# Patient Record
Sex: Female | Born: 1980 | Race: White | Hispanic: No | Marital: Married | Smoking: Current every day smoker
Health system: Southern US, Community
[De-identification: ages and names within clinical notes are randomized; demographics above are authoritative.]

## PROBLEM LIST (undated history)

## (undated) DIAGNOSIS — Z8489 Family history of other specified conditions: Secondary | ICD-10-CM

## (undated) DIAGNOSIS — K219 Gastro-esophageal reflux disease without esophagitis: Secondary | ICD-10-CM

## (undated) DIAGNOSIS — R519 Headache, unspecified: Secondary | ICD-10-CM

## (undated) DIAGNOSIS — F419 Anxiety disorder, unspecified: Secondary | ICD-10-CM

## (undated) HISTORY — PX: WISDOM TOOTH EXTRACTION: SHX21

## (undated) HISTORY — PX: TUBAL LIGATION: SHX77

---

## 2001-03-30 ENCOUNTER — Other Ambulatory Visit: Admission: RE | Admit: 2001-03-30 | Discharge: 2001-03-30 | Payer: Self-pay | Admitting: Obstetrics and Gynecology

## 2003-11-19 ENCOUNTER — Inpatient Hospital Stay (HOSPITAL_COMMUNITY): Admission: AD | Admit: 2003-11-19 | Discharge: 2003-11-19 | Payer: Self-pay | Admitting: Gynecology

## 2003-12-19 ENCOUNTER — Inpatient Hospital Stay (HOSPITAL_COMMUNITY): Admission: AD | Admit: 2003-12-19 | Discharge: 2003-12-22 | Payer: Self-pay | Admitting: Gynecology

## 2003-12-21 ENCOUNTER — Encounter (INDEPENDENT_AMBULATORY_CARE_PROVIDER_SITE_OTHER): Payer: Self-pay | Admitting: Specialist

## 2005-07-11 ENCOUNTER — Ambulatory Visit: Payer: Self-pay | Admitting: Obstetrics & Gynecology

## 2005-07-16 ENCOUNTER — Ambulatory Visit (HOSPITAL_COMMUNITY): Admission: RE | Admit: 2005-07-16 | Discharge: 2005-07-16 | Payer: Self-pay | Admitting: *Deleted

## 2006-05-16 ENCOUNTER — Inpatient Hospital Stay (HOSPITAL_COMMUNITY): Admission: AD | Admit: 2006-05-16 | Discharge: 2006-05-16 | Payer: Self-pay | Admitting: Family Medicine

## 2009-12-21 ENCOUNTER — Emergency Department: Payer: Self-pay | Admitting: Emergency Medicine

## 2010-03-21 ENCOUNTER — Emergency Department: Payer: Self-pay | Admitting: Emergency Medicine

## 2010-09-14 NOTE — Op Note (Signed)
NAMEFREDRICA, CAPANO                          ACCOUNT NO.:  [id number]   MEDICAL RECORD NO.:  [id number]                    PATIENT TYPE:   LOCATION:                                       FACILITY:   PHYSICIAN:  Ginger Carne, MD               DATE OF BIRTH:   DATE OF PROCEDURE:  12/21/2003  DATE OF DISCHARGE:                                 OPERATIVE REPORT   PREOPERATIVE DIAGNOSES:  Request for permanent sterilization.   POSTOPERATIVE DIAGNOSES:  Request for permanent sterilization.   PROCEDURE:  [place] postpartum bilateral tubal ligation.   SURGEON:  Ginger Carne, MD   ASSISTANT:  None.   COMPLICATIONS:  None immediate.   ESTIMATED BLOOD LOSS:  Negligible.   ANESTHESIA:  Epidural top off.   SPECIMENS:  Right and left partial tubes.   FINDINGS:  Both tubes were identified separate and apart from their  respective round ligaments. These were identified from their isthmus to  their fimbriated ends. Both ovaries were normal.   DESCRIPTION OF PROCEDURE:  The patient was prepped and draped in the usual  fashion and placed in the supine position. Betadine solution used for  antiseptic and the patient was catheterized prior to the procedure.  After  adequate epidural top off, a vertical infraumbilical incision was made and  the abdomen opened.  Once again both tubes were identified separate and  apart from their respective round ligaments.  Two centimeters of tube were  incorporated between #0 chromic ties twice. The tubes were severed above the  ties and the tips cauterized. Specimens bilaterally sent to pathology. No  active bleeding noted. Closure of the fascia with #0 Vicryl running suture  and 4-0 Vicryl for subcuticular closure. Instrument and sponge count were  correct. The patient tolerated the procedure well and went to post  anesthesia recovery in excellent condition.                                               Ginger Carne, MD    SHB/MEDQ  D:   12/21/2003  T:  12/21/2003  Job:  [id number]

## 2010-09-14 NOTE — Group Therapy Note (Signed)
NAMESUTTYN, CRYDER NO.:  [id number]   MEDICAL RECORD NO.:  [id number]           PATIENT TYPE:   LOCATION:  [facility]                     FACILITY:   PHYSICIAN:  Elsie Lincoln, MD           DATE OF BIRTH:   DATE OF SERVICE:  07/11/2005                                    CLINIC NOTE   [facility]   This is a 30 year old G2 female, who has had abnormal uterine bleeding and  pelvic pain for a year.  Before the birth of her last child, she had  completely normal menses.  She has had a Provera withdrawal in the past, and  had a normal TSH and a normal FSH back in July of 2006.  She is not on any  birth control right now.  However, she has had a tubal ligation after the  birth of her last baby.  She also has severe dysmenorrhea and is worried.  She thinks she may have endometriosis.  Both her sister and her mom have  endometriosis, and her mother with underwent a hysterectomy at 59 for  endometriosis.  She has no urinary complaints, but; however, her bowel  movements are disruptive to her life.  She has 3 non-formed stools a day.  She also has urgency with bowel movements.  When she has the urge to go, she  feels she has to go to the bathroom immediately or she will not make it.  She never has had a diagnosis of irritable bowel syndrome.  She does not  think she is lactose intolerant; however, ice cream does make it worse.  She  does remember going several days without dairy products in her diet and she  still has to 3 unformed stools.  However, we will recommend a dairy-free  diet.   PAST MEDICAL HISTORY:  No change.   PAST SURGICAL HISTORY:  BTL.   PAST GYNECOLOGICAL HISTORY:  NSVD x2, history of bacterial vaginosis and  yeast.   SOCIAL HISTORY:  Smokes occasionally.  Encouraged to quit.   REVIEW OF SYMPTOMS:  As above.   PHYSICAL EXAMINATION:  VITAL SIGNS:  Blood pressure 116/79, pulse 85, height  5 feet 4 inches and weight  258.  GENERAL:  Obese, well-nourished and well-developed in no apparent distress.  ABDOMEN:  Soft, nontender and nondistended, no organomegaly, no rebound, no  guarding and no hernias.  GENITALIA:  Tanner V.  Some evidence on her mons pubis and legs of old areas  of folliculitis that are now well healed.  The vagina pink and normal rugae.  Urethral meatus, no prolapse.  Vulva no lesion.  Cervix closed and  nontender.  Uterus difficult to palpate; however, nontender.  Bilateral  adnexa are both tender, but no masses felt, the right side was worse than  the left.  RECTAL EXAM:  Deferred.   ASSESSMENT AND PLAN:  A 30 year old female with irregular menses and pelvic  pain.  1.  Urine pregnancy test today.  2.  Gonorrhea and Chlamydia today.  3.  Transvaginal ultrasound to evaluate for cysts, fibroids  and adenomyosis.  4.  Endometrial biopsy in 2 weeks with Motrin 600 mg pre-procedure.  5.  Dairy-free diet.  6.  If the above are all normal, we will refer her to primary care physician      for IBS workup.           ______________________________  Elsie Lincoln, MD     KL/MEDQ  D:  07/11/2005  T:  07/11/2005  Job:  [id number]

## 2010-10-07 ENCOUNTER — Emergency Department: Payer: Self-pay | Admitting: Internal Medicine

## 2010-11-27 ENCOUNTER — Ambulatory Visit: Payer: Self-pay | Admitting: Family Medicine

## 2013-07-08 ENCOUNTER — Ambulatory Visit: Payer: Self-pay | Admitting: Nurse Practitioner

## 2015-01-27 ENCOUNTER — Ambulatory Visit
Admission: RE | Admit: 2015-01-27 | Payer: BLUE CROSS/BLUE SHIELD | Source: Ambulatory Visit | Admitting: Unknown Physician Specialty

## 2015-01-27 ENCOUNTER — Encounter: Admission: RE | Payer: Self-pay | Source: Ambulatory Visit

## 2015-01-27 ENCOUNTER — Ambulatory Visit: Admit: 2015-01-27 | Payer: Self-pay | Admitting: Unknown Physician Specialty

## 2015-01-27 SURGERY — ESOPHAGOGASTRODUODENOSCOPY (EGD) WITH PROPOFOL
Anesthesia: General

## 2016-11-21 ENCOUNTER — Emergency Department
Admission: EM | Admit: 2016-11-21 | Discharge: 2016-11-21 | Disposition: A | Discharge status: Home / Self Care | Payer: 59 | Attending: Emergency Medicine | Admitting: Emergency Medicine

## 2016-11-21 ENCOUNTER — Emergency Department: Payer: 59

## 2016-11-21 ENCOUNTER — Encounter: Payer: Self-pay | Admitting: Emergency Medicine

## 2016-11-21 DIAGNOSIS — Z5321 Procedure and treatment not carried out due to patient leaving prior to being seen by health care provider: Secondary | ICD-10-CM | POA: Insufficient documentation

## 2016-11-21 DIAGNOSIS — R42 Dizziness and giddiness: Secondary | ICD-10-CM | POA: Diagnosis present

## 2016-11-21 LAB — DIFFERENTIAL
BASOS PCT: 1 %
Basophils Absolute: 0.1 10*3/uL (ref 0–0.1)
EOS ABS: 0.1 10*3/uL (ref 0–0.7)
EOS PCT: 1 %
Lymphocytes Relative: 16 %
Lymphs Abs: 1.8 10*3/uL (ref 1.0–3.6)
MONO ABS: 0.7 10*3/uL (ref 0.2–0.9)
Monocytes Relative: 6 %
NEUTROS ABS: 8.5 10*3/uL — AB (ref 1.4–6.5)
Neutrophils Relative %: 76 %

## 2016-11-21 LAB — COMPREHENSIVE METABOLIC PANEL
ALT: 28 U/L (ref 14–54)
ANION GAP: 8 (ref 5–15)
AST: 28 U/L (ref 15–41)
Albumin: 3.8 g/dL (ref 3.5–5.0)
Alkaline Phosphatase: 75 U/L (ref 38–126)
BUN: 8 mg/dL (ref 6–20)
CHLORIDE: 104 mmol/L (ref 101–111)
CO2: 25 mmol/L (ref 22–32)
Calcium: 8.8 mg/dL — ABNORMAL LOW (ref 8.9–10.3)
Creatinine, Ser: 0.61 mg/dL (ref 0.44–1.00)
GFR calc non Af Amer: >60 mL/min (ref >60)
Glucose, Bld: 103 mg/dL — ABNORMAL HIGH (ref 65–99)
POTASSIUM: 3.7 mmol/L (ref 3.5–5.1)
SODIUM: 137 mmol/L (ref 135–145)
Total Bilirubin: 0.5 mg/dL (ref 0.3–1.2)
Total Protein: 7.3 g/dL (ref 6.5–8.1)

## 2016-11-21 LAB — CBC
HCT: 37.8 % (ref 35.0–47.0)
Hemoglobin: 13.2 g/dL (ref 12.0–16.0)
MCH: 32.6 pg (ref 26.0–34.0)
MCHC: 34.8 g/dL (ref 32.0–36.0)
MCV: 93.6 fL (ref 80.0–100.0)
PLATELETS: 239 10*3/uL (ref 150–440)
RBC: 4.04 MIL/uL (ref 3.80–5.20)
RDW: 12.8 % (ref 11.5–14.5)
WBC: 11.2 10*3/uL — AB (ref 3.6–11.0)

## 2016-11-21 LAB — PROTIME-INR
INR: 0.9
PROTHROMBIN TIME: 12.1 s (ref 11.4–15.2)

## 2016-11-21 LAB — TROPONIN I: Troponin I: <0.03 ng/mL (ref <0.03)

## 2016-11-21 LAB — APTT: APTT: 29 s (ref 24–36)

## 2016-11-21 NOTE — ED Triage Notes (Signed)
Pt presents with dizziness x 3 days. States she has had intermittent peripheral vision loss on right side since then. She states that she has pain in her neck, as well as a headache. Pt alert & oriented with NAD noted.

## 2016-11-22 ENCOUNTER — Telehealth: Payer: Self-pay | Admitting: Emergency Medicine

## 2016-11-22 NOTE — Telephone Encounter (Signed)
Called patient due to lwot to inquire about condition and follow up plans. Left message.   

## 2017-06-20 ENCOUNTER — Other Ambulatory Visit: Payer: Self-pay | Admitting: Surgery

## 2017-06-20 ENCOUNTER — Ambulatory Visit
Admission: RE | Admit: 2017-06-20 | Discharge: 2017-06-20 | Disposition: A | Discharge status: Home / Self Care | Payer: 59 | Source: Ambulatory Visit | Attending: Surgery | Admitting: Surgery

## 2017-06-20 DIAGNOSIS — M79661 Pain in right lower leg: Secondary | ICD-10-CM

## 2019-04-26 IMAGING — CT CT HEAD W/O CM
3 series · 15 of 46 positions shown, 18 images · non-contrast
Comparison: None.

CLINICAL DATA: Dizziness for 3 days with intermittent right
peripheral vision loss.

EXAM:
CT HEAD WITHOUT CONTRAST
TECHNIQUE: Contiguous axial images were obtained from the base of the skull
through the vertex without intravenous contrast.

[Series 2: head wo · axial · 0.49mm/px · z∈[-151,-31]mm · 9 of 29 slices shown, 12 images]
[im 3/29  brain]
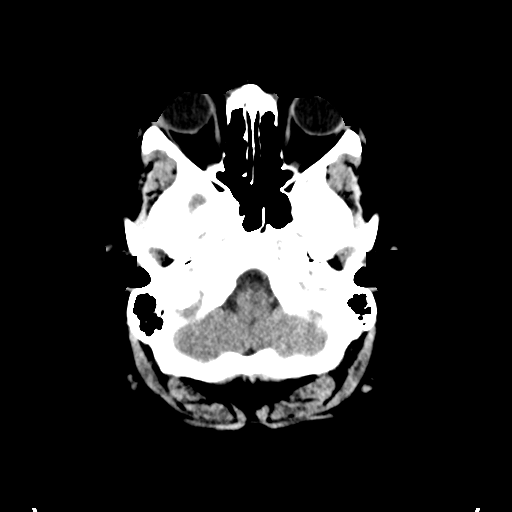
[im 3/29  bone]
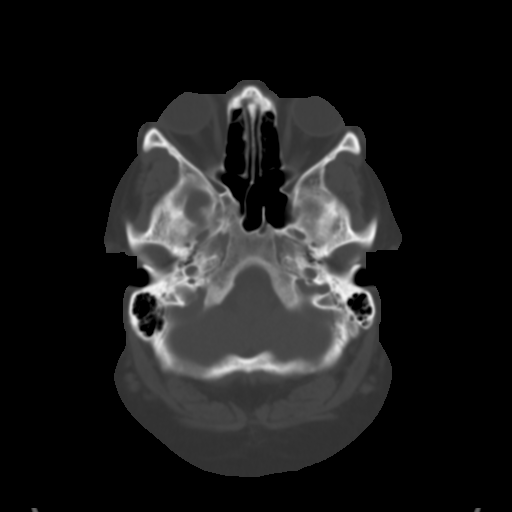
[im 6/29  brain]
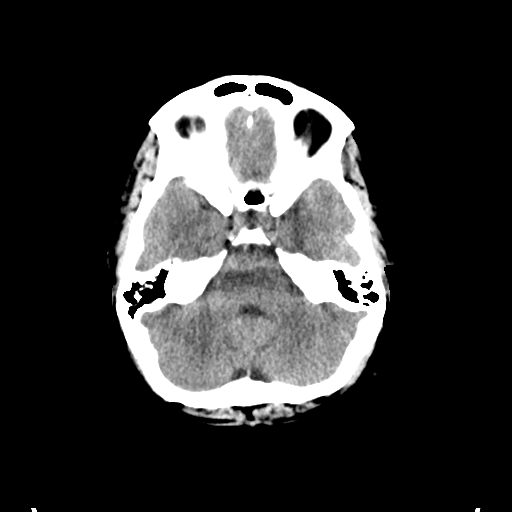
[im 9/29  brain]
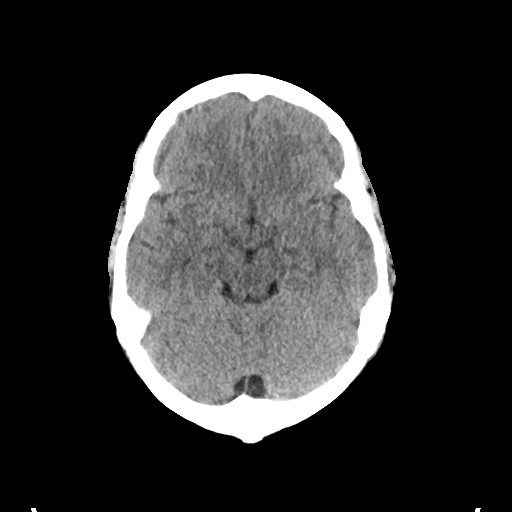
[im 12/29  brain]
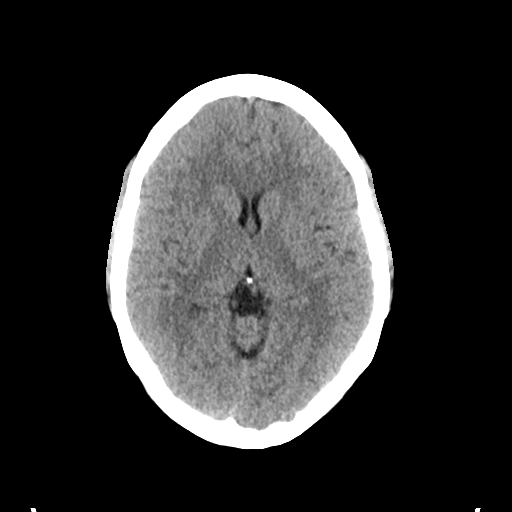
[im 15/29  brain]
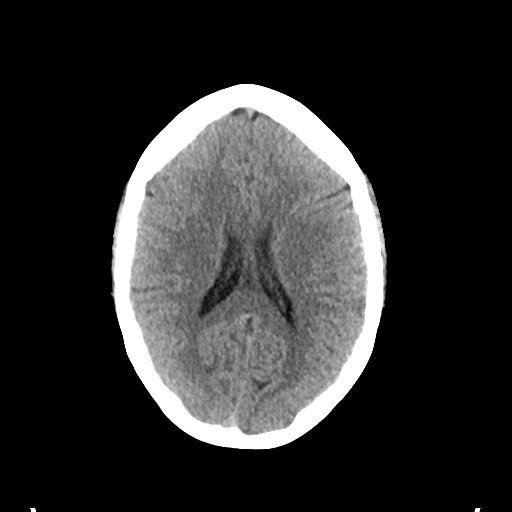
[im 15/29  bone]
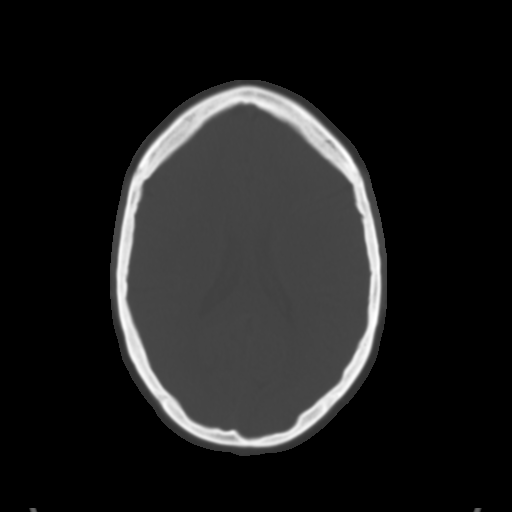
[im 18/29  brain]
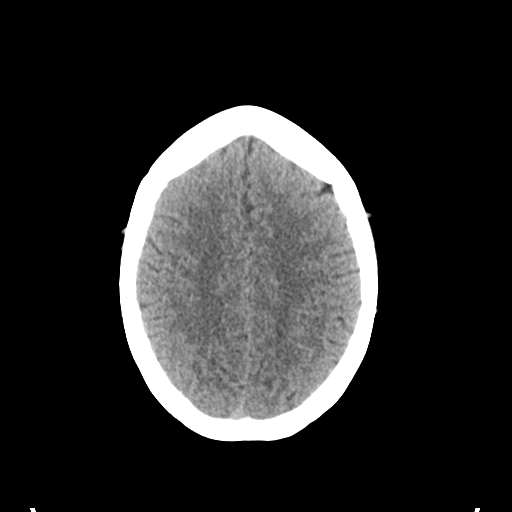
[im 21/29  brain]
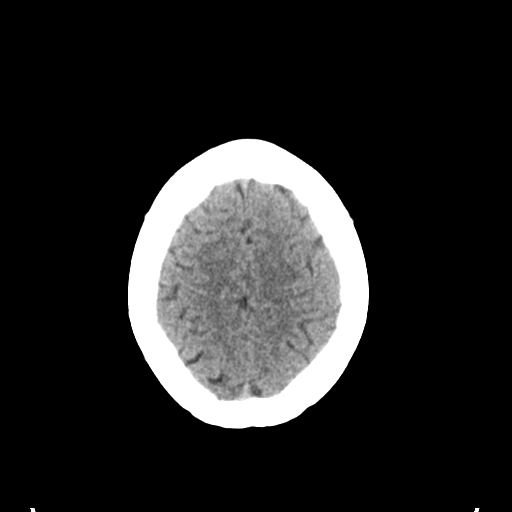
[im 24/29  brain]
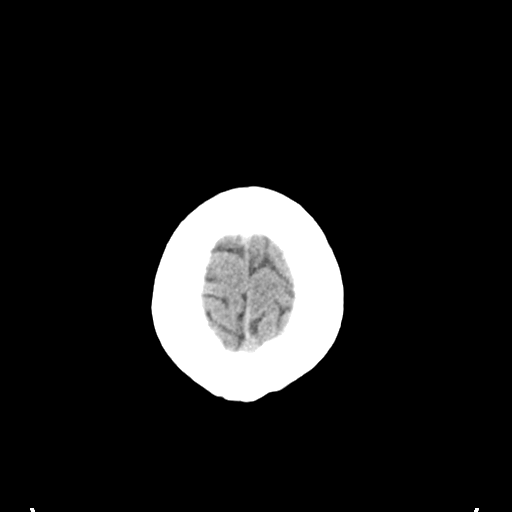
[im 27/29  brain]
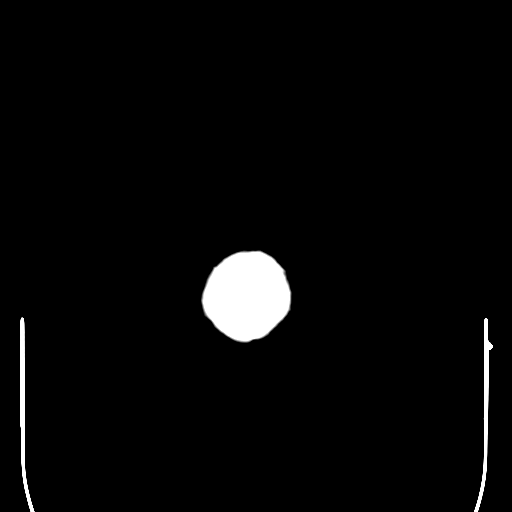
[im 27/29  bone]
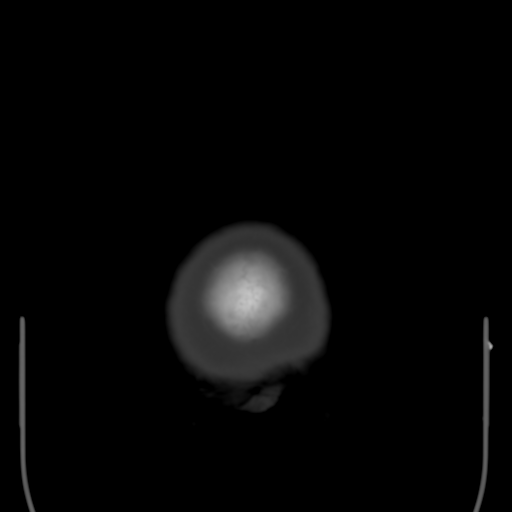

[Series 4: coronal soft tissue · coronal · 0.28mm/px · 3 of 63 slices shown]
[im 21/63  brain]
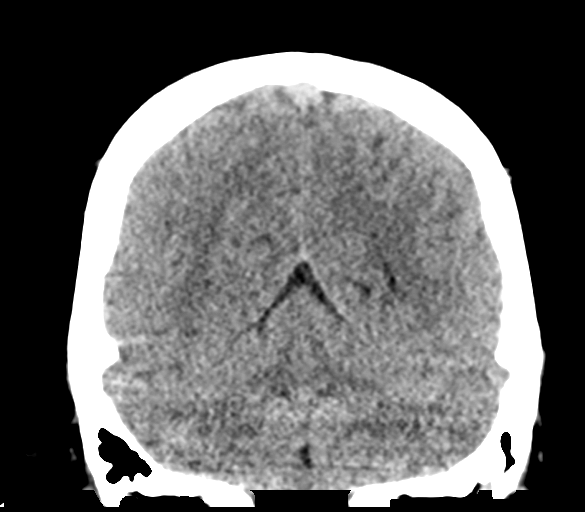
[im 28/63  brain]
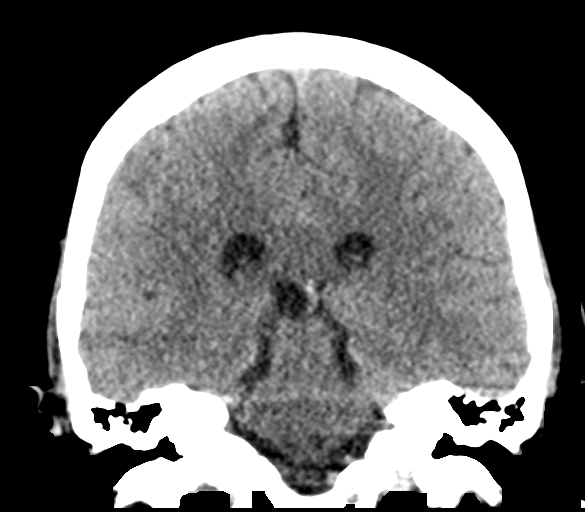
[im 35/63  brain]
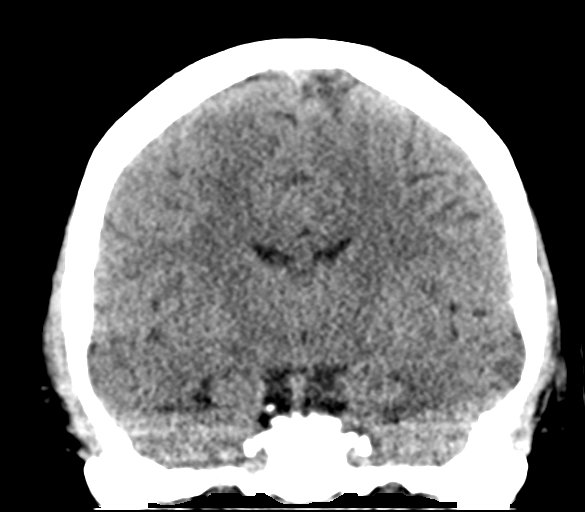

[Series 5: sagittal soft tissue · sagittal · 0.29mm/px · 3 of 49 slices shown]
[im 17/49  brain]
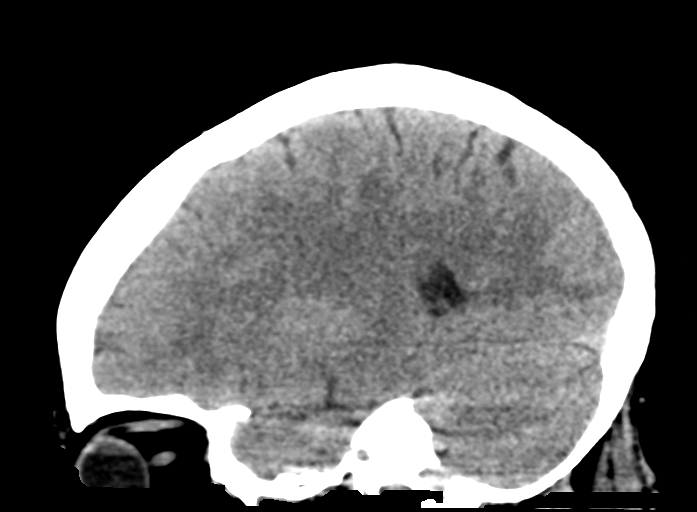
[im 25/49  brain]
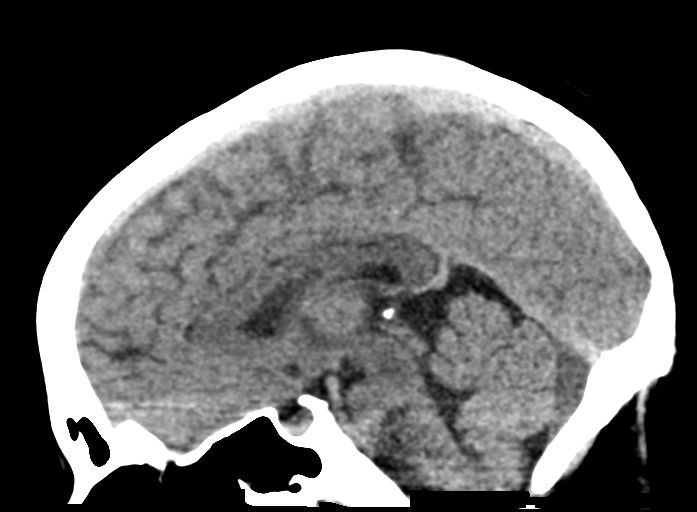
[im 33/49  brain]
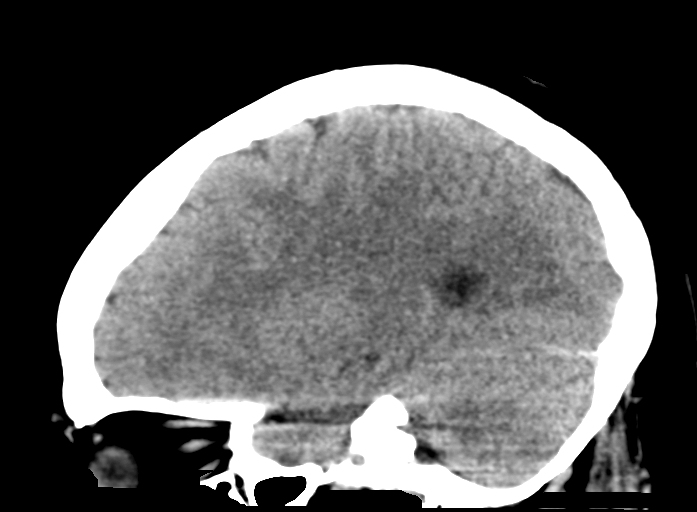

[15 of 46 positions shown; findings below may reference images not displayed]

FINDINGS: Brain: No evidence of acute infarction, hemorrhage, hydrocephalus,
extra-axial collection or mass lesion/mass effect.

Vascular: No hyperdense vessel or unexpected calcification.

Skull: Normal. Negative for fracture or focal lesion.

Sinuses/Orbits: No acute finding.

Other: None.
IMPRESSION: No focal acute intracranial abnormality identified.

## 2020-11-06 ENCOUNTER — Other Ambulatory Visit: Payer: Self-pay | Admitting: Family Medicine

## 2020-11-06 DIAGNOSIS — Z1231 Encounter for screening mammogram for malignant neoplasm of breast: Secondary | ICD-10-CM

## 2021-05-15 ENCOUNTER — Other Ambulatory Visit: Payer: Self-pay | Admitting: Neurosurgery

## 2021-05-15 DIAGNOSIS — Z01818 Encounter for other preprocedural examination: Secondary | ICD-10-CM

## 2021-05-22 ENCOUNTER — Other Ambulatory Visit: Payer: Self-pay

## 2021-05-22 ENCOUNTER — Other Ambulatory Visit
Admission: RE | Admit: 2021-05-22 | Discharge: 2021-05-22 | Disposition: A | Discharge status: Home / Self Care | Payer: Managed Care, Other (non HMO) | Source: Ambulatory Visit | Attending: Neurosurgery | Admitting: Neurosurgery

## 2021-05-22 DIAGNOSIS — Z01812 Encounter for preprocedural laboratory examination: Secondary | ICD-10-CM | POA: Diagnosis present

## 2021-05-22 HISTORY — DX: Family history of other specified conditions: Z84.89

## 2021-05-22 HISTORY — DX: Anxiety disorder, unspecified: F41.9

## 2021-05-22 HISTORY — DX: Headache, unspecified: R51.9

## 2021-05-22 HISTORY — DX: Gastro-esophageal reflux disease without esophagitis: K21.9

## 2021-05-22 LAB — URINALYSIS, ROUTINE W REFLEX MICROSCOPIC
Bilirubin Urine: NEGATIVE
Glucose, UA: NEGATIVE mg/dL
Ketones, ur: NEGATIVE mg/dL
Nitrite: NEGATIVE
Protein, ur: NEGATIVE mg/dL
Specific Gravity, Urine: 1.015 (ref 1.005–1.030)
pH: 5 (ref 5.0–8.0)

## 2021-05-22 LAB — TYPE AND SCREEN
ABO/RH(D): A POS
Antibody Screen: NEGATIVE

## 2021-05-22 LAB — PROTIME-INR
INR: 0.9 (ref 0.8–1.2)
Prothrombin Time: 12.3 seconds (ref 11.4–15.2)

## 2021-05-22 LAB — BASIC METABOLIC PANEL
Anion gap: 11 (ref 5–15)
BUN: 11 mg/dL (ref 6–20)
CO2: 26 mmol/L (ref 22–32)
Calcium: 8.8 mg/dL — ABNORMAL LOW (ref 8.9–10.3)
Chloride: 95 mmol/L — ABNORMAL LOW (ref 98–111)
Creatinine, Ser: 0.63 mg/dL (ref 0.44–1.00)
GFR, Estimated: >60 mL/min (ref >60)
Glucose, Bld: 98 mg/dL (ref 70–99)
Potassium: 3.3 mmol/L — ABNORMAL LOW (ref 3.5–5.1)
Sodium: 132 mmol/L — ABNORMAL LOW (ref 135–145)

## 2021-05-22 LAB — CBC
HCT: 42.6 % (ref 36.0–46.0)
Hemoglobin: 14.9 g/dL (ref 12.0–15.0)
MCH: 32 pg (ref 26.0–34.0)
MCHC: 35 g/dL (ref 30.0–36.0)
MCV: 91.6 fL (ref 80.0–100.0)
Platelets: 332 10*3/uL (ref 150–400)
RBC: 4.65 MIL/uL (ref 3.87–5.11)
RDW: 12.6 % (ref 11.5–15.5)
WBC: 17.3 10*3/uL — ABNORMAL HIGH (ref 4.0–10.5)
nRBC: 0 % (ref 0.0–0.2)

## 2021-05-22 LAB — APTT: aPTT: 31 seconds (ref 24–36)

## 2021-05-22 LAB — SURGICAL PCR SCREEN
MRSA, PCR: NEGATIVE
Staphylococcus aureus: NEGATIVE

## 2021-05-22 NOTE — Patient Instructions (Addendum)
Your procedure is scheduled on: Wed. 05/30/21 Report to Day Surgery.  Registration Desk first To find out your arrival time please call [phone] between 1PM - 3PM on Tues 1/31.  Remember: Instructions that are not followed completely may result in serious medical risk,  up to and including death, or upon the discretion of your surgeon and anesthesiologist your  surgery may need to be rescheduled.     _X__ 1. Do not eat food after midnight the night before your procedure.                 No chewing gum or hard candies. You may drink clear liquids up to 2 hours                 before you are scheduled to arrive for your surgery- DO not drink clear                 liquids within 2 hours of the start of your surgery.                 Clear Liquids include:  water, apple juice without pulp, clear Gatorade, G2 or                  Gatorade Zero (avoid Red/Purple/Blue), Black Coffee or Tea (Do not add                 anything to coffee or tea). __X__2.  On the morning of surgery brush your teeth with toothpaste and water, you                may rinse your mouth with mouthwash if you wish.  Do not swallow any toothpaste of mouthwash.     _X__ 3.  No Alcohol for 24 hours before or after surgery.   _X__ 4.  Do Not Smoke or use e-cigarettes For 24 Hours Prior to Your Surgery.                 Do not use any chewable tobacco products for at least 6 hours prior to                 Surgery.  __  5.  Do not use any recreational drugs (marijuana, cocaine, heroin, ecstasy,  MDMA or other) For at least one week prior to your surgery.   Combination of these drugs with anesthesia may have life threatening  results.  ____  6.  Bring all medications with you on the day of surgery if instructed.   __x__  7.  Notify your doctor if there is any change in your medical condition      (cold, fever, infections).     Do not wear jewelry, make-up, hairpins, clips or nail polish. Do not wear  lotions, powders, or perfumes.  Do not shave 48 hours prior to surgery. Do not bring valuables to the hospital.    [facility] is not responsible for any belongings or valuables.  Contacts, dentures or bridgework may not be worn into surgery. Leave your suitcase in the car. After surgery it may be brought to your room. For patients admitted to the hospital, discharge time is determined by your treatment team.   Patients discharged the day of surgery will not be allowed to drive home.   Make arrangements for someone to be with you for the first 24 hours of your Same Day Discharge.    Please read over the following fact sheets that  you were given:       _x___ Take these medicines the morning of surgery with A SIP OF WATER:    1. methocarbamol (ROBAXIN) 500 MG tablet  if needed  2. pantoprazole (PROTONIX) 40 MG tablet  dose the night before and the morning of surgery  3. propranolol (INDERAL) 10 MG tablet  4.  5.  6.  ____ Fleet Enema (as directed)   __x__ Use CHG Soap (or wipes) as directed  ____ Use Benzoyl Peroxide Gel as instructed  ____ Use inhalers on the day of surgery  ____ Stop metformin 2 days prior to surgery    ____ Take 1/2 of usual insulin dose the night before surgery. No insulin the morning          of surgery.   ____ Stop Coumadin/Plavix/aspirin on   __x__ Stop Anti-inflammatories no ibuprofen aleve or aspirin products 1 week prior   ____ Stop supplements until after surgery.    ____ Bring C-Pap to the hospital.    If you have any questions regarding your pre-procedure instructions,  Please call Pre-admit Testing at [phone]

## 2021-05-30 ENCOUNTER — Encounter
Admission: RE | Disposition: A | Discharge status: Home / Self Care | Payer: Self-pay | Source: Ambulatory Visit | Attending: Neurosurgery

## 2021-05-30 ENCOUNTER — Other Ambulatory Visit: Payer: Self-pay

## 2021-05-30 ENCOUNTER — Ambulatory Visit: Payer: Managed Care, Other (non HMO) | Admitting: Anesthesiology

## 2021-05-30 ENCOUNTER — Ambulatory Visit: Payer: Managed Care, Other (non HMO) | Admitting: Urgent Care

## 2021-05-30 ENCOUNTER — Ambulatory Visit: Payer: Managed Care, Other (non HMO)

## 2021-05-30 ENCOUNTER — Ambulatory Visit
Admission: RE | Admit: 2021-05-30 | Discharge: 2021-05-31 | Disposition: A | Discharge status: Home / Self Care | Payer: Managed Care, Other (non HMO) | Source: Ambulatory Visit | Attending: Neurosurgery | Admitting: Neurosurgery

## 2021-05-30 ENCOUNTER — Encounter: Payer: Self-pay | Admitting: Neurosurgery

## 2021-05-30 DIAGNOSIS — R29818 Other symptoms and signs involving the nervous system: Secondary | ICD-10-CM | POA: Insufficient documentation

## 2021-05-30 DIAGNOSIS — K219 Gastro-esophageal reflux disease without esophagitis: Secondary | ICD-10-CM | POA: Diagnosis not present

## 2021-05-30 DIAGNOSIS — F17200 Nicotine dependence, unspecified, uncomplicated: Secondary | ICD-10-CM | POA: Insufficient documentation

## 2021-05-30 DIAGNOSIS — G992 Myelopathy in diseases classified elsewhere: Secondary | ICD-10-CM | POA: Insufficient documentation

## 2021-05-30 DIAGNOSIS — M4802 Spinal stenosis, cervical region: Secondary | ICD-10-CM | POA: Diagnosis not present

## 2021-05-30 DIAGNOSIS — F419 Anxiety disorder, unspecified: Secondary | ICD-10-CM | POA: Insufficient documentation

## 2021-05-30 DIAGNOSIS — Z6841 Body Mass Index (BMI) 40.0 and over, adult: Secondary | ICD-10-CM | POA: Diagnosis not present

## 2021-05-30 DIAGNOSIS — G959 Disease of spinal cord, unspecified: Secondary | ICD-10-CM

## 2021-05-30 HISTORY — PX: CERVICAL DISC ARTHROPLASTY: SHX587

## 2021-05-30 LAB — POCT PREGNANCY, URINE: Preg Test, Ur: NEGATIVE

## 2021-05-30 LAB — ABO/RH: ABO/RH(D): A POS

## 2021-05-30 SURGERY — CERVICAL ANTERIOR DISC ARTHROPLASTY
Anesthesia: General | Site: Spine Cervical

## 2021-05-30 MED ORDER — CHLORHEXIDINE GLUCONATE 0.12 % MT SOLN
OROMUCOSAL | Status: AC
  Administered 2021-05-30: 15 mL via OROMUCOSAL
  Filled 2021-05-30: qty 15

## 2021-05-30 MED ORDER — METHOCARBAMOL 500 MG PO TABS: 500.0000 mg | ORAL_TABLET | Freq: Four times a day (QID) | ORAL | 0 refills | Status: DC | PRN

## 2021-05-30 MED ORDER — SUCCINYLCHOLINE CHLORIDE 200 MG/10ML IV SOSY
PREFILLED_SYRINGE | INTRAVENOUS | Status: DC | PRN
Start: 2021-05-30 — End: 2021-05-30
  Administered 2021-05-30: 140 mg via INTRAVENOUS

## 2021-05-30 MED ORDER — BUPIVACAINE-EPINEPHRINE (PF) 0.5% -1:200000 IJ SOLN
INTRAMUSCULAR | Status: DC | PRN
  Administered 2021-05-30: 10 mL

## 2021-05-30 MED ORDER — LIDOCAINE HCL (CARDIAC) PF 100 MG/5ML IV SOSY
PREFILLED_SYRINGE | INTRAVENOUS | Status: DC | PRN
Start: 2021-05-30 — End: 2021-05-30
  Administered 2021-05-30: 100 mg via INTRAVENOUS

## 2021-05-30 MED ORDER — PROMETHAZINE HCL 25 MG/ML IJ SOLN: 6.2500 mg | INTRAMUSCULAR | Status: DC | PRN

## 2021-05-30 MED ORDER — MIDAZOLAM HCL 2 MG/2ML IJ SOLN
INTRAMUSCULAR | Status: AC
  Filled 2021-05-30: qty 2

## 2021-05-30 MED ORDER — PROPOFOL 10 MG/ML IV BOLUS
INTRAVENOUS | Status: DC | PRN
Start: 2021-05-30 — End: 2021-05-30
  Administered 2021-05-30: 150 mg via INTRAVENOUS

## 2021-05-30 MED ORDER — ORAL CARE MOUTH RINSE: 15.0000 mL | Freq: Once | OROMUCOSAL | Status: AC

## 2021-05-30 MED ORDER — APREPITANT 40 MG PO CAPS
ORAL_CAPSULE | ORAL | Status: AC
  Filled 2021-05-30: qty 1

## 2021-05-30 MED ORDER — FENTANYL CITRATE (PF) 100 MCG/2ML IJ SOLN
INTRAMUSCULAR | Status: AC
  Filled 2021-05-30: qty 2

## 2021-05-30 MED ORDER — CELECOXIB 100 MG PO CAPS: 100.0000 mg | ORAL_CAPSULE | Freq: Two times a day (BID) | ORAL | 0 refills | Status: DC

## 2021-05-30 MED ORDER — FENTANYL CITRATE (PF) 100 MCG/2ML IJ SOLN
25.0000 ug | INTRAMUSCULAR | Status: DC | PRN
  Administered 2021-05-30 (×2): 25 ug via INTRAVENOUS

## 2021-05-30 MED ORDER — OXYCODONE HCL 5 MG PO TABS: 5.0000 mg | ORAL_TABLET | ORAL | 0 refills | Status: DC | PRN

## 2021-05-30 MED ORDER — CEFAZOLIN IN SODIUM CHLORIDE 3-0.9 GM/100ML-% IV SOLN
3.0000 g | INTRAVENOUS | Status: AC
  Administered 2021-05-30: 3 g via INTRAVENOUS
  Filled 2021-05-30: qty 100

## 2021-05-30 MED ORDER — EPHEDRINE SULFATE (PRESSORS) 50 MG/ML IJ SOLN
INTRAMUSCULAR | Status: DC | PRN
  Administered 2021-05-30: 5 mg via INTRAVENOUS

## 2021-05-30 MED ORDER — ACETAMINOPHEN 10 MG/ML IV SOLN
INTRAVENOUS | Status: DC | PRN
Start: 2021-05-30 — End: 2021-05-30
  Administered 2021-05-30: 1000 mg via INTRAVENOUS

## 2021-05-30 MED ORDER — SURGIFLO WITH THROMBIN (HEMOSTATIC MATRIX KIT) OPTIME
TOPICAL | Status: DC | PRN
  Administered 2021-05-30: 1 via TOPICAL

## 2021-05-30 MED ORDER — OXYCODONE HCL 5 MG/5ML PO SOLN: 5.0000 mg | Freq: Once | ORAL | Status: AC | PRN

## 2021-05-30 MED ORDER — CEFAZOLIN SODIUM 10 G IJ SOLR
3.0000 g | Freq: Once | INTRAMUSCULAR | Status: DC
  Filled 2021-05-30: qty 3000

## 2021-05-30 MED ORDER — OXYCODONE HCL 5 MG PO TABS
ORAL_TABLET | ORAL | Status: AC
  Filled 2021-05-30: qty 1

## 2021-05-30 MED ORDER — CEFAZOLIN SODIUM-DEXTROSE 2-4 GM/100ML-% IV SOLN
INTRAVENOUS | Status: AC
  Filled 2021-05-30: qty 100

## 2021-05-30 MED ORDER — PHENYLEPHRINE HCL (PRESSORS) 10 MG/ML IV SOLN
INTRAVENOUS | Status: AC
  Filled 2021-05-30: qty 1

## 2021-05-30 MED ORDER — SENNA 8.6 MG PO TABS: 1.0000 | ORAL_TABLET | Freq: Every day | ORAL | 0 refills | Status: DC | PRN

## 2021-05-30 MED ORDER — FENTANYL CITRATE (PF) 100 MCG/2ML IJ SOLN
INTRAMUSCULAR | Status: DC | PRN
  Administered 2021-05-30 (×2): 50 ug via INTRAVENOUS

## 2021-05-30 MED ORDER — DEXMEDETOMIDINE HCL IN NACL 200 MCG/50ML IV SOLN
INTRAVENOUS | Status: DC | PRN
  Administered 2021-05-30: 12 ug via INTRAVENOUS

## 2021-05-30 MED ORDER — APREPITANT 40 MG PO CAPS: 40.0000 mg | ORAL_CAPSULE | Freq: Once | ORAL | Status: DC

## 2021-05-30 MED ORDER — LACTATED RINGERS IV SOLN: INTRAVENOUS | Status: DC

## 2021-05-30 MED ORDER — DEXAMETHASONE SODIUM PHOSPHATE 10 MG/ML IJ SOLN
INTRAMUSCULAR | Status: DC | PRN
Start: 2021-05-30 — End: 2021-05-30
  Administered 2021-05-30: 10 mg via INTRAVENOUS

## 2021-05-30 MED ORDER — GLYCOPYRROLATE 0.2 MG/ML IJ SOLN
INTRAMUSCULAR | Status: DC | PRN
  Administered 2021-05-30: .2 mg via INTRAVENOUS

## 2021-05-30 MED ORDER — CHLORHEXIDINE GLUCONATE 0.12 % MT SOLN: 15.0000 mL | Freq: Once | OROMUCOSAL | Status: AC

## 2021-05-30 MED ORDER — 0.9 % SODIUM CHLORIDE (POUR BTL) OPTIME
TOPICAL | Status: DC | PRN
Start: 2021-05-30 — End: 2021-05-30
  Administered 2021-05-30: 1000 mL

## 2021-05-30 MED ORDER — PROPOFOL 500 MG/50ML IV EMUL
INTRAVENOUS | Status: DC | PRN
  Administered 2021-05-30: 155 ug/kg/min via INTRAVENOUS

## 2021-05-30 MED ORDER — PROPOFOL 1000 MG/100ML IV EMUL
INTRAVENOUS | Status: AC
  Filled 2021-05-30: qty 400

## 2021-05-30 MED ORDER — OXYCODONE HCL 5 MG PO TABS
5.0000 mg | ORAL_TABLET | Freq: Once | ORAL | Status: AC | PRN
  Administered 2021-05-30: 5 mg via ORAL

## 2021-05-30 MED ORDER — PHENYLEPHRINE HCL-NACL 20-0.9 MG/250ML-% IV SOLN
INTRAVENOUS | Status: DC | PRN
Start: 2021-05-30 — End: 2021-05-30
  Administered 2021-05-30: 15 ug/min via INTRAVENOUS

## 2021-05-30 MED ORDER — ONDANSETRON HCL 4 MG/2ML IJ SOLN
INTRAMUSCULAR | Status: DC | PRN
  Administered 2021-05-30 (×2): 4 mg via INTRAVENOUS

## 2021-05-30 MED ORDER — ACETAMINOPHEN 10 MG/ML IV SOLN
INTRAVENOUS | Status: AC
  Filled 2021-05-30: qty 100

## 2021-05-30 MED ORDER — MIDAZOLAM HCL 2 MG/2ML IJ SOLN
INTRAMUSCULAR | Status: DC | PRN
  Administered 2021-05-30: 2 mg via INTRAVENOUS

## 2021-05-30 MED ORDER — REMIFENTANIL HCL 1 MG IV SOLR
INTRAVENOUS | Status: AC
  Filled 2021-05-30: qty 1000

## 2021-05-30 MED ORDER — SODIUM CHLORIDE FLUSH 0.9 % IV SOLN
INTRAVENOUS | Status: AC
  Filled 2021-05-30: qty 10

## 2021-05-30 MED ORDER — ACETAMINOPHEN 10 MG/ML IV SOLN: 1000.0000 mg | Freq: Once | INTRAVENOUS | Status: AC | PRN

## 2021-05-30 MED ORDER — REMIFENTANIL HCL 2 MG IV SOLR
INTRAVENOUS | Status: DC | PRN
  Administered 2021-05-30: .2 ug/kg/min via INTRAVENOUS

## 2021-05-30 MED ORDER — MANNITOL 20 % IV SOLN
INTRAVENOUS | Status: AC
  Filled 2021-05-30: qty 500

## 2021-05-30 MED ORDER — BUPIVACAINE-EPINEPHRINE (PF) 0.5% -1:200000 IJ SOLN
INTRAMUSCULAR | Status: AC
  Filled 2021-05-30: qty 30

## 2021-05-30 SURGICAL SUPPLY — 57 items
BNDG GAUZE ELAST 4 BULKY (GAUZE/BANDAGES/DRESSINGS) ×4 IMPLANT
BUR MATCHSTICK NEURO 3.0 LAGG (BURR) ×2 IMPLANT
CHLORAPREP W/TINT 26 (MISCELLANEOUS) ×6 IMPLANT
COUNTER NEEDLE 20/40 LG (NEEDLE) ×3 IMPLANT
CUP MEDICINE 2OZ PLAST GRAD ST (MISCELLANEOUS) ×3 IMPLANT
DERMABOND ADVANCED (GAUZE/BANDAGES/DRESSINGS) ×1
DERMABOND ADVANCED .7 DNX12 (GAUZE/BANDAGES/DRESSINGS) ×2 IMPLANT
DISC MOBI-C CERVICAL 13X15 H5 (Miscellaneous) ×2 IMPLANT
DRAPE C-ARM 42X72 X-RAY (DRAPES) ×4 IMPLANT
DRAPE INCISE IOBAN 66X45 STRL (DRAPES) ×2 IMPLANT
DRAPE LAPAROTOMY 77X122 PED (DRAPES) ×3 IMPLANT
DRAPE MICROSCOPE SPINE 48X150 (DRAPES) ×3 IMPLANT
DRAPE SURG 17X11 SM STRL (DRAPES) ×6 IMPLANT
ELECT CAUTERY BLADE TIP 2.5 (TIP) ×3
ELECT REM PT RETURN 9FT ADLT (ELECTROSURGICAL) ×3
ELECTRODE CAUTERY BLDE TIP 2.5 (TIP) ×2 IMPLANT
ELECTRODE REM PT RTRN 9FT ADLT (ELECTROSURGICAL) ×2 IMPLANT
FEE INTRAOP CADWELL SUPPLY NCS (MISCELLANEOUS) ×2 IMPLANT
FEE INTRAOP MONITOR IMPULS NCS (MISCELLANEOUS) ×1 IMPLANT
GAUZE 4X4 16PLY ~~LOC~~+RFID DBL (SPONGE) ×3 IMPLANT
GLOVE SURG SYN 6.5 ES PF (GLOVE) ×3 IMPLANT
GLOVE SURG SYN 6.5 PF PI (GLOVE) ×1 IMPLANT
GLOVE SURG SYN 8.5  E (GLOVE) ×9
GLOVE SURG SYN 8.5 E (GLOVE) ×6 IMPLANT
GLOVE SURG SYN 8.5 PF PI (GLOVE) ×3 IMPLANT
GLOVE SURG UNDER POLY LF SZ6.5 (GLOVE) ×3 IMPLANT
GOWN SRG LRG LVL 4 IMPRV REINF (GOWNS) ×2 IMPLANT
GOWN SRG XL LVL 3 NONREINFORCE (GOWNS) ×2 IMPLANT
GOWN STRL NON-REIN TWL XL LVL3 (GOWNS) ×3
GOWN STRL REIN LRG LVL4 (GOWNS) ×3
GRADUATE 1200CC STRL 31836 (MISCELLANEOUS) ×3 IMPLANT
INTRAOP CADWELL SUPPLY FEE NCS (MISCELLANEOUS) ×2
INTRAOP DISP SUPPLY FEE NCS (MISCELLANEOUS) ×3
INTRAOP MONITOR FEE IMPULS NCS (MISCELLANEOUS) ×2
INTRAOP MONITOR FEE IMPULSE (MISCELLANEOUS) ×3
KIT TURNOVER KIT A (KITS) ×3 IMPLANT
MANIFOLD NEPTUNE II (INSTRUMENTS) ×3 IMPLANT
MARKER SKIN DUAL TIP RULER LAB (MISCELLANEOUS) ×6 IMPLANT
NDL SAFETY ECLIPSE 18X1.5 (NEEDLE) ×2 IMPLANT
NEEDLE HYPO 18GX1.5 SHARP (NEEDLE) ×3
NEEDLE HYPO 22GX1.5 SAFETY (NEEDLE) ×3 IMPLANT
NS IRRIG 1000ML POUR BTL (IV SOLUTION) ×3 IMPLANT
PACK LAMINECTOMY NEURO (CUSTOM PROCEDURE TRAY) ×3 IMPLANT
PAD ARMBOARD 7.5X6 YLW CONV (MISCELLANEOUS) ×6 IMPLANT
PIN CASPAR 14 (PIN) ×1 IMPLANT
PIN CASPAR 14MM (PIN)
PIN CASPAR SPINAL 12MM (PIN) ×2 IMPLANT
SPONGE KITTNER 5P (MISCELLANEOUS) ×3 IMPLANT
STAPLER SKIN PROX 35W (STAPLE) ×2 IMPLANT
SURGIFLO W/THROMBIN 8M KIT (HEMOSTASIS) ×3 IMPLANT
SUT V-LOC 90 ABS DVC 3-0 CL (SUTURE) ×3 IMPLANT
SUT VIC AB 3-0 SH 8-18 (SUTURE) ×3 IMPLANT
SYR 30ML LL (SYRINGE) ×3 IMPLANT
TAPE CLOTH 3X10 WHT NS LF (GAUZE/BANDAGES/DRESSINGS) ×3 IMPLANT
TOWEL OR 17X26 4PK STRL BLUE (TOWEL DISPOSABLE) ×9 IMPLANT
TUBING CONNECTING 10 (TUBING) ×3 IMPLANT
WATER STERILE IRR 500ML POUR (IV SOLUTION) ×3 IMPLANT

## 2021-05-30 NOTE — Anesthesia Postprocedure Evaluation (Signed)
Anesthesia Post Note  Patient: Alicia Burns  Procedure(s) Performed: C5-6 CERVICAL ANTERIOR DISC ARTHROPLASTY (Spine Cervical)  Patient location during evaluation: PACU Anesthesia Type: General Level of consciousness: awake and alert Pain management: pain level controlled Vital Signs Assessment: post-procedure vital signs reviewed and stable Respiratory status: spontaneous breathing, nonlabored ventilation and respiratory function stable Cardiovascular status: blood pressure returned to baseline and stable Postop Assessment: no apparent nausea or vomiting Anesthetic complications: no   No notable events documented.   Last Vitals:  Vitals:   05/30/21 1235 05/30/21 1358  BP: 130/83 128/90  Pulse: 76 93  Resp: 15 16  Temp: (!) 36.3 C   SpO2: 93% 96%    Last Pain:  Vitals:   05/30/21 1358  TempSrc:   PainSc: 4                  Foye Deer

## 2021-05-30 NOTE — Op Note (Signed)
Indications: Ms. 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   Preoperative Note:   Risks of surgery discussed include: infection, bleeding, stroke, coma, death, paralysis, CSF leak, nerve/spinal cord injury, numbness, tingling, weakness, complex regional pain syndrome, recurrent stenosis and/or disc herniation, vascular injury, development of instability, neck/back pain, need for further surgery, persistent symptoms, development of deformity, and the risks of anesthesia. The patient understood these risks and agreed to proceed.  Operative Note:  Procedure:  1) Cervical Disc Arthroplasty at C5/6 using a LDR Mobi-C device   Procedure: After obtaining informed consent, the patient taken to the operating room, placed in supine position, general anesthesia induced.  The patient had a small shoulder roll placed behind the neck.  The patient received preop antibiotics and IV Decadron.  The patient had a neck incision outlined, was prepped and draped in usual sterile fashion. The incision was injected with local anesthetic.   An incision was opened, dissection taken down medial to the carotid artery and jugular vein, lateral to the trachea and esophagus.  The prevertebral fascia identified and a localizing x-ray demonstrated the correct level.  The longus colli were dissected laterally, and self-retaining retractors placed to open the operative field. The microscope was then brought into the field.  With this complete, distractor pins were  placed in the vertebral bodies of C5 and C6. The distractor was placed, and the anulus at C5/6 was opened using a bovie.  Curettes and pituitary rongeurs used to remove the majority of disk, then the drill was used to remove the posterior osteophyte and begin the foraminotomies. The nerve hook was used to elevate the posterior longitudinal ligament, which was then removed with Kerrison rongeurs. The microblunt nerve hook could be passed out the foramen bilaterally.   Meticulous hemostasis was obtained.  A trial spacer was used to size the disc space. Using flouroscopic guidance, a 15 mm width x 13 mm depth x 5 mm height Mobi-C was then inserted in the prepared disc space.  The caspar distractor was removed, and bone wax used for hemostasis. Final AP and lateral radiographs were taken.   With the disc arthroplasty in good position, the wound was irrigated copiously with bacitracin-containing solution and meticulous hemostasis obtained.  Wound was closed in 2 layers using interrupted inverted 3-0 Vicryl sutures.  The wound was dressed with dermabond, the head of bed at 30 degrees, taken to recovery room in stable condition.  No new postop neurological deficits were identified.  Sponge and pattie counts were correct at the end of the procedure.   Monitoring was used throughout without changes.  I performed the entire procedure with the assistance of Manning Charity PA as an Designer, television/film set.  Venetia Night MD

## 2021-05-30 NOTE — Progress Notes (Signed)
Pharmacy Antibiotic Note  Alicia Burns is a 41 y.o. female admitted on (Not on file) with surgical prophylaxis.  Pharmacy has been consulted for Cefazolin dosing.  TBW = 128.4 kg   Plan: Cefazolin 3 gm IV X 1 60 min pre-op ordered for 2/01 @ 0500      No data recorded.  No results for input(s): WBC, CREATININE, LATICACIDVEN, VANCOTROUGH, VANCOPEAK, VANCORANDOM, GENTTROUGH, GENTPEAK, GENTRANDOM, TOBRATROUGH, TOBRAPEAK, TOBRARND, AMIKACINPEAK, AMIKACINTROU, AMIKACIN in the last 168 hours.  Estimated Creatinine Clearance: 123 mL/min (by C-G formula based on SCr of 0.63 mg/dL).    Allergies  Allergen Reactions   Hydrocodone-Acetaminophen Nausea Only    Antimicrobials this admission:   >>    >>   Dose adjustments this admission:   Microbiology results:  BCx:   UCx:    Sputum:    MRSA PCR:   Thank you for allowing pharmacy to be a part of this patients care.  Leondra Cullin D 05/30/2021 1:58 AM

## 2021-05-30 NOTE — H&P (Signed)
I have reviewed and confirmed my history and physical from 05/15/2021 with no additions or changes. Plan for C5-6 arthroplasty.  Risks and benefits reviewed.  Heart sounds normal no MRG. Chest Clear to Auscultation Bilaterally.

## 2021-05-30 NOTE — Anesthesia Preprocedure Evaluation (Addendum)
Anesthesia Evaluation  Patient identified by MRN, date of birth, ID band Patient awake    Reviewed: Allergy & Precautions, NPO status , Patient's Chart, lab work & pertinent test results  Airway Mallampati: II  TM Distance: >3 FB Neck ROM: full    Dental no notable dental hx.    Pulmonary Current Smoker and Patient abstained from smoking.,    Pulmonary exam normal        Cardiovascular Exercise Tolerance: Poor METS: Normal cardiovascular exam     Neuro/Psych  Headaches, PSYCHIATRIC DISORDERS Anxiety Stenosis of cervical spine Cervical myelopathy     GI/Hepatic Neg liver ROS, GERD  Medicated,  Endo/Other  Morbid obesity  Renal/GU      Musculoskeletal   Abdominal (+) + obese,   Peds  Hematology negative hematology ROS (+)   Anesthesia Other Findings Past Medical History: No date: Anxiety No date: Family history of adverse reaction to anesthesia     Comment:  family with PONV No date: GERD (gastroesophageal reflux disease) No date: Headache  Past Surgical History: No date: TUBAL LIGATION No date: WISDOM TOOTH EXTRACTION     Reproductive/Obstetrics negative OB ROS                           Anesthesia Physical Anesthesia Plan  ASA: 3  Anesthesia Plan: General ETT   Post-op Pain Management: Ofirmev IV (intra-op) and Toradol IV (intra-op)   Induction: Intravenous  PONV Risk Score and Plan: 3 and Ondansetron, Dexamethasone, Midazolam, Treatment may vary due to age or medical condition, Propofol infusion and TIVA  Airway Management Planned: Oral ETT  Additional Equipment:   Intra-op Plan:   Post-operative Plan: Extubation in OR  Informed Consent: I have reviewed the patients History and Physical, chart, labs and discussed the procedure including the risks, benefits and alternatives for the proposed anesthesia with the patient or authorized representative who has indicated  his/her understanding and acceptance.     Dental Advisory Given  Plan Discussed with: Anesthesiologist, CRNA and Surgeon  Anesthesia Plan Comments:       Anesthesia Quick Evaluation

## 2021-05-30 NOTE — Discharge Summary (Signed)
Discharge Summary  Patient ID: Alicia Burns MRN: [id number] DOB/AGE: [birth year] 41 y.o.  Admit date: 05/30/2021 Discharge date: 05/30/2021  Admission Diagnoses: cervical stenosis   Discharge Diagnoses:  Active Problems:   * No active hospital problems. *   Discharged Condition: good  Hospital Course:  Alicia Burns is a 41 y.o presenting with cervical stenosis s/p C5-6 athroplasty on 05/30/21. Her interoperative course was uncomplicated. She was monitored in the PACU for 4 hours  post-op and discharged home after ambulating, urinating, and tolerating PO intake. She was given medications for pain  Consults: None  Significant Diagnostic Studies: none  Treatments: surgery: as above. Please see separately dictated operative report for further details  Discharge Exam: Blood pressure (!) 137/102, pulse 76, temperature (!) 96.8 F (36 C), temperature source Temporal, resp. rate 16, last menstrual period 05/04/2021, SpO2 96 %. CN II-XII grossly intact 5/5 throughout  Incision c/d/i  Disposition: Discharge disposition: 01-Home or Self Care       Discharge Instructions     Diet - low sodium heart healthy   Complete by: As directed    Increase activity slowly   Complete by: As directed       Allergies as of 05/30/2021       Reactions   Hydrocodone-acetaminophen Nausea Only        Medication List     TAKE these medications    celecoxib 100 MG capsule Commonly known as: CeleBREX Take 1 capsule (100 mg total) by mouth 2 (two) times daily.   hydrochlorothiazide 25 MG tablet Commonly known as: HYDRODIURIL Take 25 mg by mouth daily.   methocarbamol 500 MG tablet Commonly known as: Robaxin Take 1 tablet (500 mg total) by mouth every 6 (six) hours as needed for muscle spasms. What changed:  how much to take when to take this reasons to take this   oxyCODONE 5 MG immediate release tablet Commonly known as: Roxicodone Take 1 tablet (5 mg total) by mouth every  4 (four) hours as needed for severe pain.   pantoprazole 40 MG tablet Commonly known as: PROTONIX Take 40 mg by mouth daily.   predniSONE 10 MG tablet Commonly known as: DELTASONE Take 10 mg by mouth daily as needed (Tngle in fingers).   propranolol 10 MG tablet Commonly known as: INDERAL Take 10 mg by mouth daily.   senna 8.6 MG Tabs tablet Commonly known as: SENOKOT Take 1 tablet (8.6 mg total) by mouth daily as needed for mild constipation.   SUMAtriptan 50 MG tablet Commonly known as: IMITREX Take 50 mg by mouth daily as needed for migraine.        Follow-up Information     Susanne Borders, PA Follow up in 2 week(s).   Why: for post-op incison check Contact information: [address] [place] [address] [phone]                 Signed: Susanne Borders 05/30/2021, 10:28 AM

## 2021-05-30 NOTE — Progress Notes (Signed)
Able to ambulate and void. No numbness or tingling. Pain down to what she considers a tolerable number of 4/10. Voices readiness for discharge to home.

## 2021-05-30 NOTE — Anesthesia Procedure Notes (Addendum)
Procedure Name: Intubation Date/Time: 05/30/2021 8:45 AM Performed by: Mohammed Kindle, CRNA Pre-anesthesia Checklist: Patient identified, Emergency Drugs available, Suction available and Patient being monitored Patient Re-evaluated:Patient Re-evaluated prior to induction Oxygen Delivery Method: Circle system utilized Preoxygenation: Pre-oxygenation with 100% oxygen Induction Type: IV induction Ventilation: Mask ventilation without difficulty Laryngoscope Size: McGraph and 3 Grade View: Grade I Tube type: Oral Tube size: 7.0 mm Number of attempts: 1 Airway Equipment and Method: Stylet and Oral airway Placement Confirmation: ETT inserted through vocal cords under direct vision, positive ETCO2, breath sounds checked- equal and bilateral and CO2 detector Secured at: 21 cm Tube secured with: Tape Dental Injury: Teeth and Oropharynx as per pre-operative assessment

## 2021-05-30 NOTE — Transfer of Care (Signed)
Immediate Anesthesia Transfer of Care Note  Patient: Alicia Burns  Procedure(s) Performed: C5-6 CERVICAL ANTERIOR DISC ARTHROPLASTY (Spine Cervical)  Patient Location: PACU  Anesthesia Type:General  Level of Consciousness: awake, drowsy and patient cooperative  Airway & Oxygen Therapy: Patient Spontanous Breathing and Patient connected to face mask oxygen  Post-op Assessment: Report given to RN and Post -op Vital signs reviewed and stable  Post vital signs: Reviewed and stable  Last Vitals:  Vitals Value Taken Time  BP 88/74 05/30/21 1038  Temp 35.9 C 05/30/21 1038  Pulse 83 05/30/21 1045  Resp 15 05/30/21 1040  SpO2 96 % 05/30/21 1045  Vitals shown include unvalidated device data.  Last Pain:  Vitals:   05/30/21 1038  TempSrc:   PainSc: 0-No pain         Complications: No notable events documented.

## 2021-05-30 NOTE — Discharge Instructions (Addendum)
Your surgeon has performed an operation on your cervical spine (neck) to relieve pressure on the spinal cord and/or nerves. This involved making an incision in the front of your neck and removing one or more of the discs that support your spine. Next, a small piece of bone, a titanium plate, and screws were used to fuse two or more of the vertebrae (bones) together.  The following are instructions to help in your recovery once you have been discharged from the hospital. Even if you feel well, it is important that you follow these activity guidelines. If you do not let your neck heal properly from the surgery, you can increase the chance of return of your symptoms and other complications.  Activity    No bending, lifting, or twisting (BLT). Avoid lifting objects heavier than 10 pounds (gallon milk jug).  Where possible, avoid household activities that involve lifting, bending, reaching, pushing, or pulling such as laundry, vacuuming, grocery shopping, and childcare. Try to arrange for help from friends and family for these activities while your back heals.  Increase physical activity slowly as tolerated.  Taking short walks is encouraged, but avoid strenuous exercise. Do not jog, run, bicycle, lift weights, or participate in any other exercises unless specifically allowed by your doctor.  Talk to your doctor before resuming sexual activity.  You should not drive until cleared by your doctor.  Until released by your doctor, you should not return to work or school.  You should rest at home and let your body heal.   You may shower two days after your surgery.  After showering, lightly dab your incision dry. Do not take a tub bath or go swimming until approved by your doctor at your follow-up appointment.   If you smoke, we strongly recommend that you quit.  Smoking has been proven to interfere with normal bone healing and will dramatically reduce the success rate of your surgery. Please contact  [facility] (800-QUIT-NOW) and use the resources at www.[facility].com for assistance in stopping smoking.  Surgical Incision   Keep your incision area clean and dry.  Your incision was closed with Dermabond glue. The glue should begin to peel away within about a week.  Diet           You may return to your usual diet. However, you may experience discomfort when swallowing in the first month after your surgery. This is normal. You may find that softer foods are more comfortable for you to swallow. Be sure to stay hydrated.  When to Contact us  You may experience pain in your neck and/or pain between your shoulder blades. This is normal and should improve in the next few weeks with the help of pain medication, muscle relaxers, and rest. Some patients report that a warm compress on the back of the neck or between the shoulder blades helps.  However, should you experience any of the following, contact us immediately: New numbness or weakness Pain that is progressively getting worse, and is not relieved by your pain medication, muscle relaxers, rest, and warm compresses Bleeding, redness, swelling, pain, or drainage from surgical incision Chills or flu-like symptoms Fever greater than 101.0 F (38.3 C) Inability to eat, drink fluids, or take medications Problems with bowel or bladder functions Difficulty breathing or shortness of breath Warmth, tenderness, or swelling in your calf Contact Information During office hours (Monday-Friday 9 am to 5 pm), please call your physician at [phone] and ask for Sharlot Gowda After hours and weekends, please  call [phone] and speak with the answering service, who will contact the doctor on call.  If that fails, call the [facility] Operator at [phone] and ask for the Neurosurgery Resident On Call  For a life-threatening emergency, call 911   AMBULATORY SURGERY  DISCHARGE INSTRUCTIONS   The drugs that you were given will stay in your system  until tomorrow so for the next 24 hours you should not:  Drive an automobile Make any legal decisions Drink any alcoholic beverage   You may resume regular meals tomorrow.  Today it is better to start with liquids and gradually work up to solid foods.  You may eat anything you prefer, but it is better to start with liquids, then soup and crackers, and gradually work up to solid foods.   Please notify your doctor immediately if you have any unusual bleeding, trouble breathing, redness and pain at the surgery site, drainage, fever, or pain not relieved by medication.    Additional Instructions:        Please contact your physician with any problems or Same Day Surgery at [phone], Monday through Friday 6 am to 4 pm, or [place] [facility] number at [phone].

## 2021-05-31 ENCOUNTER — Encounter: Payer: Self-pay | Admitting: Neurosurgery

## 2021-07-06 ENCOUNTER — Encounter: Payer: Self-pay | Admitting: Neurosurgery

## 2021-10-31 ENCOUNTER — Encounter: Payer: Self-pay | Admitting: Emergency Medicine

## 2021-10-31 ENCOUNTER — Emergency Department: Payer: Managed Care, Other (non HMO)

## 2021-10-31 ENCOUNTER — Other Ambulatory Visit: Payer: Self-pay

## 2021-10-31 ENCOUNTER — Emergency Department
Admission: EM | Admit: 2021-10-31 | Discharge: 2021-10-31 | Disposition: A | Discharge status: Home / Self Care | Payer: Managed Care, Other (non HMO) | Attending: Emergency Medicine | Admitting: Emergency Medicine

## 2021-10-31 DIAGNOSIS — N3000 Acute cystitis without hematuria: Secondary | ICD-10-CM | POA: Diagnosis not present

## 2021-10-31 DIAGNOSIS — M79605 Pain in left leg: Secondary | ICD-10-CM | POA: Insufficient documentation

## 2021-10-31 DIAGNOSIS — R600 Localized edema: Secondary | ICD-10-CM | POA: Insufficient documentation

## 2021-10-31 LAB — CBC WITH DIFFERENTIAL/PLATELET
Abs Immature Granulocytes: 0.06 10*3/uL (ref 0.00–0.07)
Basophils Absolute: 0.1 10*3/uL (ref 0.0–0.1)
Basophils Relative: 1 %
Eosinophils Absolute: 0.2 10*3/uL (ref 0.0–0.5)
Eosinophils Relative: 2 %
HCT: 44.9 % (ref 36.0–46.0)
Hemoglobin: 15.2 g/dL — ABNORMAL HIGH (ref 12.0–15.0)
Immature Granulocytes: 1 %
Lymphocytes Relative: 17 %
Lymphs Abs: 2 10*3/uL (ref 0.7–4.0)
MCH: 30.7 pg (ref 26.0–34.0)
MCHC: 33.9 g/dL (ref 30.0–36.0)
MCV: 90.7 fL (ref 80.0–100.0)
Monocytes Absolute: 0.4 10*3/uL (ref 0.1–1.0)
Monocytes Relative: 3 %
Neutro Abs: 9 10*3/uL — ABNORMAL HIGH (ref 1.7–7.7)
Neutrophils Relative %: 76 %
Platelets: 317 10*3/uL (ref 150–400)
RBC: 4.95 MIL/uL (ref 3.87–5.11)
RDW: 12.5 % (ref 11.5–15.5)
WBC: 11.7 10*3/uL — ABNORMAL HIGH (ref 4.0–10.5)
nRBC: 0 % (ref 0.0–0.2)

## 2021-10-31 LAB — URINALYSIS, COMPLETE (UACMP) WITH MICROSCOPIC
Bilirubin Urine: NEGATIVE
Glucose, UA: NEGATIVE mg/dL
Ketones, ur: NEGATIVE mg/dL
Leukocytes,Ua: NEGATIVE
Nitrite: NEGATIVE
Protein, ur: NEGATIVE mg/dL
Specific Gravity, Urine: 1.021 (ref 1.005–1.030)
Squamous Epithelial / HPF: NONE SEEN (ref 0–5)
WBC, UA: NONE SEEN WBC/hpf (ref 0–5)
pH: 5 (ref 5.0–8.0)

## 2021-10-31 LAB — COMPREHENSIVE METABOLIC PANEL
ALT: 28 U/L (ref 0–44)
AST: 30 U/L (ref 15–41)
Albumin: 3.8 g/dL (ref 3.5–5.0)
Alkaline Phosphatase: 77 U/L (ref 38–126)
Anion gap: 9 (ref 5–15)
BUN: 10 mg/dL (ref 6–20)
CO2: 24 mmol/L (ref 22–32)
Calcium: 9.1 mg/dL (ref 8.9–10.3)
Chloride: 103 mmol/L (ref 98–111)
Creatinine, Ser: 0.62 mg/dL (ref 0.44–1.00)
GFR, Estimated: >60 mL/min (ref >60)
Glucose, Bld: 153 mg/dL — ABNORMAL HIGH (ref 70–99)
Potassium: 3.8 mmol/L (ref 3.5–5.1)
Sodium: 136 mmol/L (ref 135–145)
Total Bilirubin: 0.7 mg/dL (ref 0.3–1.2)
Total Protein: 7.6 g/dL (ref 6.5–8.1)

## 2021-10-31 LAB — PREGNANCY, URINE: Preg Test, Ur: NEGATIVE

## 2021-10-31 MED ORDER — CEPHALEXIN 500 MG PO CAPS: 500.0000 mg | ORAL_CAPSULE | Freq: Four times a day (QID) | ORAL | 0 refills | Status: AC

## 2021-10-31 NOTE — ED Triage Notes (Signed)
Patient to ED via POV for bilateral leg pain that started last PM. Patient states she was unable to sleep during the night due to "legs jumping." Patient states she had spine surgery in Feb but not had issues with it. Ambulatory to triage.

## 2021-10-31 NOTE — ED Notes (Signed)
Pt in bed, pt reports decreased pain, pt states that she is ready to go home, pt verbalized understanding d/c and follow up, pt ambulatory from dpt.

## 2021-10-31 NOTE — ED Notes (Addendum)
Pt states she just finished a course oa abx for a UTI. Urine dark in color. Urine sent to lab if needed.

## 2021-10-31 NOTE — ED Provider Notes (Signed)
[facility] Provider Note    Event Date/Time   First MD Initiated Contact with Patient 10/31/21 1438     (approximate)   History   Leg Pain   HPI  Alicia Burns is a 41 y.o. female with past medical history of anxiety, GERD and cervical disc arthroplasty hearing this year and has previous tubal ligation who presents for evaluation of some acute lower back pain and achiness in both legs as well as a "jitteriness" in both legs that she states she felt came on last night.  She does not recall any rashes injuries or falls or numbness.  He feels her pain is symmetric.  She denies any specific burning with urination blood or abnormal vaginal bleeding or discharge.  No abdominal pain, chest pain, cough, shortness of breath, headache earache, sore throat vomiting diarrhea or constipation.  She does note she was recently treated for UTI and finished Macrobid 3 days ago.  She has no other acute concerns at this time.    Past Medical History:  Diagnosis Date   Anxiety    Family history of adverse reaction to anesthesia    family with PONV   GERD (gastroesophageal reflux disease)    Headache      Past Surgical History:  Procedure Laterality Date   CERVICAL DISC ARTHROPLASTY N/A 05/30/2021   Procedure: C5-6 CERVICAL ANTERIOR DISC ARTHROPLASTY;  Surgeon: Venetia Night, MD;  Location: [facility] ORS;  Service: Neurosurgery;  Laterality: N/A;   TUBAL LIGATION     WISDOM TOOTH EXTRACTION       Physical Exam  Triage Vital Signs: ED Triage Vitals  Enc Vitals Group     BP 10/31/21 1057 (!) 142/99     Pulse Rate 10/31/21 1057 100     Resp 10/31/21 1057 18     Temp 10/31/21 1057 98.5 F (36.9 C)     Temp Source 10/31/21 1057 Oral     SpO2 10/31/21 1057 100 %     Weight 10/31/21 1058 272 lb (123.4 kg)     Height --      Head Circumference --      Peak Flow --      Pain Score 10/31/21 1058 6     Pain Loc --      Pain Edu? --      Excl. in GC? --     Most  recent vital signs: Vitals:   10/31/21 1057 10/31/21 1509  BP: (!) 142/99 (!) 122/102  Pulse: 100 86  Resp: 18 18  Temp: 98.5 F (36.9 C) 97.7 F (36.5 C)  SpO2: 100% 97%    General: Awake, no distress.  CV:  Good peripheral perfusion.  2+ radial pulses. Resp:  Normal effort.  Clear bilaterally. Abd:  No distention.  Soft. Other:  Mild symmetric lower extremity edema without any induration erythema or tenderness.  Patient is full strength throughout the bilateral lower extremities.  Sensation is intact to light touch throughout bilateral lower extremities.  2+ bilateral PT pulses.  Some mild bilateral CVA tenderness.     ED Results / Procedures / Treatments  Labs (all labs ordered are listed, but only abnormal results are displayed) Labs Reviewed  CBC WITH DIFFERENTIAL/PLATELET - Abnormal; Notable for the following components:      Result Value   WBC 11.7 (*)    Hemoglobin 15.2 (*)    Neutro Abs 9.0 (*)    All other components within normal limits  COMPREHENSIVE METABOLIC PANEL -  Abnormal; Notable for the following components:   Glucose, Bld 153 (*)    All other components within normal limits  URINALYSIS, COMPLETE (UACMP) WITH MICROSCOPIC - Abnormal; Notable for the following components:   Color, Urine YELLOW (*)    APPearance TURBID (*)    Hgb urine dipstick SMALL (*)    Bacteria, UA MANY (*)    All other components within normal limits  URINE CULTURE  PREGNANCY, URINE     EKG   RADIOLOGY  CT abdomen pelvis my interpretation without evidence of perinephric stranding, kidney stone, colitis, hydronephrosis or other clear acute abdominal or pelvic process.  I reviewed radiology interpretation and agree with the findings of same.   PROCEDURES:  Critical Care performed: No  Procedures    MEDICATIONS ORDERED IN ED: Medications - No data to display   IMPRESSION / MDM / ASSESSMENT AND PLAN / ED COURSE  I reviewed the triage vital signs and the nursing notes.  Patient's presentation is most consistent with acute presentation with potential threat to life or bodily function.                               Differential diagnosis includes, but is not limited to pyelonephritis, kidney stone, cystitis, muscle spasms, dehydration, other MSK.  There is no lower extremity asymmetry of any edema erythema tenderness to suggest a DVT.  No history of any recent trauma.  Patient is neurovascular intact in her lower extremities.  Low suspicion for acute cord compression or infection.  CMP shows a glucose of 153 without any other significant lecture light or metabolic derangements.  No evidence of hepatitis or cholestatic process.  Pregnancy test is negative.  CBC shows WBC count of 11.7, hemoglobin of 15.2 and normal platelets.  UA has many bacteria noted and small hemoglobin but otherwise is unremarkable.  We will send urine culture  CT abdomen pelvis my interpretation without evidence of perinephric stranding, kidney stone, colitis, hydronephrosis or other clear acute abdominal or pelvic process.  I reviewed radiology interpretation and agree with the findings of same.  Suspect partially treated cystitis component of possible cramping.  I think it is reasonable to cover with a course of Keflex.  Low suspicion for other immediate life-threatening process at this time.  Recommended close outpatient PCP follow-up for any additional evaluation as determined at that time.  Discharged in stable condition.  Strict and precautions advised and discussed.     FINAL CLINICAL IMPRESSION(S) / ED DIAGNOSES   Final diagnoses:  Left leg pain  Acute cystitis without hematuria     Rx / DC Orders   ED Discharge Orders          Ordered    cephALEXin (KEFLEX) 500 MG capsule  4 times daily        10/31/21 1622             Note:  This document was prepared using Dragon voice recognition software and may include unintentional dictation errors.   Gilles Chiquito,  MD 10/31/21 [phone]

## 2021-11-02 LAB — URINE CULTURE: Culture: >=100000 — AB

## 2022-02-19 ENCOUNTER — Ambulatory Visit
Admission: RE | Admit: 2022-02-19 | Discharge: 2022-02-19 | Disposition: A | Discharge status: Home / Self Care | Payer: Managed Care, Other (non HMO) | Attending: General Surgery | Admitting: General Surgery

## 2022-02-19 ENCOUNTER — Encounter
Admission: RE | Disposition: A | Discharge status: Home / Self Care | Payer: Self-pay | Source: Home / Self Care | Attending: General Surgery

## 2022-02-19 ENCOUNTER — Other Ambulatory Visit: Payer: Self-pay

## 2022-02-19 ENCOUNTER — Emergency Department
Admission: EM | Admit: 2022-02-19 | Discharge: 2022-02-19 | Disposition: A | Discharge status: Home / Self Care | Payer: Managed Care, Other (non HMO) | Source: Home / Self Care | Attending: Emergency Medicine | Admitting: Emergency Medicine

## 2022-02-19 ENCOUNTER — Emergency Department: Payer: Managed Care, Other (non HMO)

## 2022-02-19 ENCOUNTER — Ambulatory Visit
Admission: RE | Admit: 2022-02-19 | Discharge: 2022-02-19 | Disposition: A | Discharge status: Home / Self Care | Payer: Self-pay | Source: Ambulatory Visit | Attending: Neurosurgery | Admitting: Neurosurgery

## 2022-02-19 ENCOUNTER — Ambulatory Visit: Payer: Managed Care, Other (non HMO) | Admitting: Anesthesiology

## 2022-02-19 ENCOUNTER — Ambulatory Visit: Payer: Self-pay | Admitting: General Surgery

## 2022-02-19 ENCOUNTER — Encounter: Payer: Self-pay | Admitting: Emergency Medicine

## 2022-02-19 DIAGNOSIS — K8012 Calculus of gallbladder with acute and chronic cholecystitis without obstruction: Secondary | ICD-10-CM | POA: Diagnosis present

## 2022-02-19 DIAGNOSIS — Z79899 Other long term (current) drug therapy: Secondary | ICD-10-CM | POA: Insufficient documentation

## 2022-02-19 DIAGNOSIS — I10 Essential (primary) hypertension: Secondary | ICD-10-CM | POA: Diagnosis not present

## 2022-02-19 DIAGNOSIS — Z049 Encounter for examination and observation for unspecified reason: Secondary | ICD-10-CM

## 2022-02-19 DIAGNOSIS — F17210 Nicotine dependence, cigarettes, uncomplicated: Secondary | ICD-10-CM | POA: Insufficient documentation

## 2022-02-19 DIAGNOSIS — Z6841 Body Mass Index (BMI) 40.0 and over, adult: Secondary | ICD-10-CM | POA: Insufficient documentation

## 2022-02-19 DIAGNOSIS — K8020 Calculus of gallbladder without cholecystitis without obstruction: Secondary | ICD-10-CM

## 2022-02-19 DIAGNOSIS — K219 Gastro-esophageal reflux disease without esophagitis: Secondary | ICD-10-CM | POA: Diagnosis not present

## 2022-02-19 DIAGNOSIS — K8070 Calculus of gallbladder and bile duct without cholecystitis without obstruction: Secondary | ICD-10-CM | POA: Insufficient documentation

## 2022-02-19 DIAGNOSIS — Z3490 Encounter for supervision of normal pregnancy, unspecified, unspecified trimester: Secondary | ICD-10-CM

## 2022-02-19 DIAGNOSIS — K8050 Calculus of bile duct without cholangitis or cholecystitis without obstruction: Secondary | ICD-10-CM

## 2022-02-19 DIAGNOSIS — R1011 Right upper quadrant pain: Secondary | ICD-10-CM

## 2022-02-19 DIAGNOSIS — R519 Headache, unspecified: Secondary | ICD-10-CM | POA: Insufficient documentation

## 2022-02-19 LAB — COMPREHENSIVE METABOLIC PANEL
ALT: 23 U/L (ref 0–44)
AST: 22 U/L (ref 15–41)
Albumin: 3.9 g/dL (ref 3.5–5.0)
Alkaline Phosphatase: 80 U/L (ref 38–126)
Anion gap: 8 (ref 5–15)
BUN: 13 mg/dL (ref 6–20)
CO2: 24 mmol/L (ref 22–32)
Calcium: 9.3 mg/dL (ref 8.9–10.3)
Chloride: 105 mmol/L (ref 98–111)
Creatinine, Ser: 0.58 mg/dL (ref 0.44–1.00)
GFR, Estimated: >60 mL/min (ref >60)
Glucose, Bld: 141 mg/dL — ABNORMAL HIGH (ref 70–99)
Potassium: 3.8 mmol/L (ref 3.5–5.1)
Sodium: 137 mmol/L (ref 135–145)
Total Bilirubin: 0.4 mg/dL (ref 0.3–1.2)
Total Protein: 7.6 g/dL (ref 6.5–8.1)

## 2022-02-19 LAB — LIPASE, BLOOD: Lipase: 32 U/L (ref 11–51)

## 2022-02-19 LAB — CBC WITH DIFFERENTIAL/PLATELET
Abs Immature Granulocytes: 0.08 10*3/uL — ABNORMAL HIGH (ref 0.00–0.07)
Basophils Absolute: 0.1 10*3/uL (ref 0.0–0.1)
Basophils Relative: 1 %
Eosinophils Absolute: 0.2 10*3/uL (ref 0.0–0.5)
Eosinophils Relative: 1 %
HCT: 42.9 % (ref 36.0–46.0)
Hemoglobin: 14.5 g/dL (ref 12.0–15.0)
Immature Granulocytes: 1 %
Lymphocytes Relative: 18 %
Lymphs Abs: 2.4 10*3/uL (ref 0.7–4.0)
MCH: 31.3 pg (ref 26.0–34.0)
MCHC: 33.8 g/dL (ref 30.0–36.0)
MCV: 92.7 fL (ref 80.0–100.0)
Monocytes Absolute: 0.7 10*3/uL (ref 0.1–1.0)
Monocytes Relative: 5 %
Neutro Abs: 9.9 10*3/uL — ABNORMAL HIGH (ref 1.7–7.7)
Neutrophils Relative %: 74 %
Platelets: 288 10*3/uL (ref 150–400)
RBC: 4.63 MIL/uL (ref 3.87–5.11)
RDW: 13 % (ref 11.5–15.5)
WBC: 13.4 10*3/uL — ABNORMAL HIGH (ref 4.0–10.5)
nRBC: 0 % (ref 0.0–0.2)

## 2022-02-19 LAB — POCT PREGNANCY, URINE: Preg Test, Ur: NEGATIVE

## 2022-02-19 SURGERY — CHOLECYSTECTOMY, ROBOT-ASSISTED, LAPAROSCOPIC
Anesthesia: General | Site: Abdomen

## 2022-02-19 MED ORDER — OXYCODONE HCL 5 MG PO TABS
ORAL_TABLET | ORAL | Status: AC
  Filled 2022-02-19: qty 1

## 2022-02-19 MED ORDER — PHENYLEPHRINE 80 MCG/ML (10ML) SYRINGE FOR IV PUSH (FOR BLOOD PRESSURE SUPPORT)
PREFILLED_SYRINGE | INTRAVENOUS | Status: DC | PRN
  Administered 2022-02-19: 80 ug via INTRAVENOUS

## 2022-02-19 MED ORDER — HYDROMORPHONE HCL 1 MG/ML IJ SOLN: 0.2500 mg | INTRAMUSCULAR | Status: DC | PRN

## 2022-02-19 MED ORDER — FAMOTIDINE 20 MG PO TABS: 20.0000 mg | ORAL_TABLET | Freq: Once | ORAL | Status: AC

## 2022-02-19 MED ORDER — HYDROMORPHONE HCL 1 MG/ML IJ SOLN
0.5000 mg | Freq: Once | INTRAMUSCULAR | Status: AC
  Administered 2022-02-19: 0.5 mg via INTRAVENOUS
  Filled 2022-02-19: qty 0.5

## 2022-02-19 MED ORDER — ONDANSETRON 4 MG PO TBDP: 4.0000 mg | ORAL_TABLET | Freq: Three times a day (TID) | ORAL | 0 refills | Status: DC | PRN

## 2022-02-19 MED ORDER — 0.9 % SODIUM CHLORIDE (POUR BTL) OPTIME
TOPICAL | Status: DC | PRN
  Administered 2022-02-19: 500 mL

## 2022-02-19 MED ORDER — ONDANSETRON HCL 4 MG/2ML IJ SOLN
4.0000 mg | Freq: Once | INTRAMUSCULAR | Status: AC
  Administered 2022-02-19: 4 mg via INTRAVENOUS

## 2022-02-19 MED ORDER — FAMOTIDINE IN NACL 20-0.9 MG/50ML-% IV SOLN
20.0000 mg | Freq: Once | INTRAVENOUS | Status: AC
Start: 2022-02-19 — End: 2022-02-19
  Administered 2022-02-19: 20 mg via INTRAVENOUS
  Filled 2022-02-19: qty 50

## 2022-02-19 MED ORDER — ORAL CARE MOUTH RINSE: 15.0000 mL | Freq: Once | OROMUCOSAL | Status: AC

## 2022-02-19 MED ORDER — INDOCYANINE GREEN 25 MG IV SOLR
1.2500 mg | Freq: Once | INTRAVENOUS | Status: AC
  Administered 2022-02-19: 1.25 mg via INTRAVENOUS
  Filled 2022-02-19: qty 0.5

## 2022-02-19 MED ORDER — OXYCODONE HCL 5 MG/5ML PO SOLN: 5.0000 mg | Freq: Once | ORAL | Status: AC | PRN

## 2022-02-19 MED ORDER — SUGAMMADEX SODIUM 500 MG/5ML IV SOLN
INTRAVENOUS | Status: DC | PRN
  Administered 2022-02-19: 500 mg via INTRAVENOUS

## 2022-02-19 MED ORDER — BUPIVACAINE-EPINEPHRINE 0.25% -1:200000 IJ SOLN
INTRAMUSCULAR | Status: DC | PRN
  Administered 2022-02-19: 30 mL

## 2022-02-19 MED ORDER — MIDAZOLAM HCL 2 MG/2ML IJ SOLN
INTRAMUSCULAR | Status: AC
  Filled 2022-02-19: qty 2

## 2022-02-19 MED ORDER — ONDANSETRON HCL 4 MG/2ML IJ SOLN
4.0000 mg | Freq: Once | INTRAMUSCULAR | Status: AC
  Administered 2022-02-19: 4 mg via INTRAVENOUS
  Filled 2022-02-19: qty 2

## 2022-02-19 MED ORDER — LIDOCAINE HCL (CARDIAC) PF 100 MG/5ML IV SOSY
PREFILLED_SYRINGE | INTRAVENOUS | Status: DC | PRN
  Administered 2022-02-19: 100 mg via INTRAVENOUS

## 2022-02-19 MED ORDER — FAMOTIDINE 20 MG PO TABS
ORAL_TABLET | ORAL | Status: AC
  Administered 2022-02-19: 20 mg via ORAL
  Filled 2022-02-19: qty 1

## 2022-02-19 MED ORDER — SUGAMMADEX SODIUM 500 MG/5ML IV SOLN
INTRAVENOUS | Status: AC
  Filled 2022-02-19: qty 5

## 2022-02-19 MED ORDER — SODIUM CHLORIDE 0.9 % IV BOLUS
500.0000 mL | Freq: Once | INTRAVENOUS | Status: AC
Start: 2022-02-19 — End: 2022-02-19
  Administered 2022-02-19: 500 mL via INTRAVENOUS

## 2022-02-19 MED ORDER — DEXAMETHASONE SODIUM PHOSPHATE 10 MG/ML IJ SOLN
INTRAMUSCULAR | Status: DC | PRN
  Administered 2022-02-19: 10 mg via INTRAVENOUS

## 2022-02-19 MED ORDER — CEFAZOLIN SODIUM-DEXTROSE 2-4 GM/100ML-% IV SOLN: 2.0000 g | INTRAVENOUS | Status: DC

## 2022-02-19 MED ORDER — OXYCODONE-ACETAMINOPHEN 5-325 MG PO TABS: 1.0000 | ORAL_TABLET | ORAL | 0 refills | Status: DC | PRN

## 2022-02-19 MED ORDER — FENTANYL CITRATE (PF) 100 MCG/2ML IJ SOLN
INTRAMUSCULAR | Status: AC
  Filled 2022-02-19: qty 2

## 2022-02-19 MED ORDER — MIDAZOLAM HCL 2 MG/2ML IJ SOLN
INTRAMUSCULAR | Status: DC | PRN
  Administered 2022-02-19: 2 mg via INTRAVENOUS

## 2022-02-19 MED ORDER — ONDANSETRON HCL 4 MG/2ML IJ SOLN
INTRAMUSCULAR | Status: AC
  Filled 2022-02-19: qty 2

## 2022-02-19 MED ORDER — KETOROLAC TROMETHAMINE 30 MG/ML IJ SOLN
15.0000 mg | Freq: Once | INTRAMUSCULAR | Status: AC
  Administered 2022-02-19: 15 mg via INTRAVENOUS
  Filled 2022-02-19: qty 1

## 2022-02-19 MED ORDER — CEFAZOLIN SODIUM-DEXTROSE 1-4 GM/50ML-% IV SOLN
INTRAVENOUS | Status: DC | PRN
  Administered 2022-02-19: 3 g via INTRAVENOUS

## 2022-02-19 MED ORDER — FENTANYL CITRATE (PF) 100 MCG/2ML IJ SOLN
INTRAMUSCULAR | Status: DC | PRN
  Administered 2022-02-19: 100 ug via INTRAVENOUS

## 2022-02-19 MED ORDER — DEXMEDETOMIDINE HCL IN NACL 200 MCG/50ML IV SOLN
INTRAVENOUS | Status: DC | PRN
  Administered 2022-02-19: 4 ug via INTRAVENOUS
  Administered 2022-02-19 (×2): 8 ug via INTRAVENOUS

## 2022-02-19 MED ORDER — OXYCODONE-ACETAMINOPHEN 5-325 MG PO TABS
1.0000 | ORAL_TABLET | Freq: Once | ORAL | Status: AC
  Administered 2022-02-19: 1 via ORAL
  Filled 2022-02-19: qty 1

## 2022-02-19 MED ORDER — LACTATED RINGERS IV SOLN: INTRAVENOUS | Status: DC

## 2022-02-19 MED ORDER — ONDANSETRON HCL 4 MG/2ML IJ SOLN
INTRAMUSCULAR | Status: DC | PRN
  Administered 2022-02-19: 4 mg via INTRAVENOUS

## 2022-02-19 MED ORDER — OXYCODONE HCL 5 MG PO TABS
5.0000 mg | ORAL_TABLET | Freq: Once | ORAL | Status: AC | PRN
  Administered 2022-02-19: 5 mg via ORAL

## 2022-02-19 MED ORDER — BUPIVACAINE-EPINEPHRINE (PF) 0.25% -1:200000 IJ SOLN
INTRAMUSCULAR | Status: AC
  Filled 2022-02-19: qty 30

## 2022-02-19 MED ORDER — CHLORHEXIDINE GLUCONATE 0.12 % MT SOLN
OROMUCOSAL | Status: AC
  Administered 2022-02-19: 15 mL via OROMUCOSAL
  Filled 2022-02-19: qty 15

## 2022-02-19 MED ORDER — PROPOFOL 10 MG/ML IV BOLUS
INTRAVENOUS | Status: DC | PRN
  Administered 2022-02-19: 200 mg via INTRAVENOUS

## 2022-02-19 MED ORDER — PROPOFOL 10 MG/ML IV BOLUS
INTRAVENOUS | Status: AC
  Filled 2022-02-19: qty 20

## 2022-02-19 MED ORDER — ROCURONIUM BROMIDE 100 MG/10ML IV SOLN
INTRAVENOUS | Status: DC | PRN
  Administered 2022-02-19: 70 mg via INTRAVENOUS

## 2022-02-19 MED ORDER — CEFAZOLIN IN SODIUM CHLORIDE 3-0.9 GM/100ML-% IV SOLN
3.0000 g | INTRAVENOUS | Status: DC
  Filled 2022-02-19: qty 100

## 2022-02-19 MED ORDER — CHLORHEXIDINE GLUCONATE 0.12 % MT SOLN: 15.0000 mL | Freq: Once | OROMUCOSAL | Status: AC

## 2022-02-19 MED ORDER — DEXAMETHASONE SODIUM PHOSPHATE 10 MG/ML IJ SOLN
INTRAMUSCULAR | Status: AC
  Filled 2022-02-19: qty 1

## 2022-02-19 SURGICAL SUPPLY — 53 items
BAG PRESSURE INF REUSE 1000 (BAG) IMPLANT
BLADE SURG SZ11 CARB STEEL (BLADE) ×2 IMPLANT
CANNULA REDUC XI 12-8 STAPL (CANNULA) ×2
CANNULA REDUCER 12-8 DVNC XI (CANNULA) ×2 IMPLANT
CATH REDDICK CHOLANGI 4FR 50CM (CATHETERS) IMPLANT
CLIP LIGATING HEM O LOK PURPLE (MISCELLANEOUS) IMPLANT
CLIP LIGATING HEMO O LOK GREEN (MISCELLANEOUS) ×2 IMPLANT
DERMABOND ADVANCED .7 DNX12 (GAUZE/BANDAGES/DRESSINGS) ×2 IMPLANT
DRAPE ARM DVNC X/XI (DISPOSABLE) ×8 IMPLANT
DRAPE C-ARM XRAY 36X54 (DRAPES) IMPLANT
DRAPE COLUMN DVNC XI (DISPOSABLE) ×2 IMPLANT
DRAPE DA VINCI XI ARM (DISPOSABLE) ×8
DRAPE DA VINCI XI COLUMN (DISPOSABLE) ×2
ELECT REM PT RETURN 9FT ADLT (ELECTROSURGICAL) ×2
ELECTRODE REM PT RTRN 9FT ADLT (ELECTROSURGICAL) ×2 IMPLANT
GLOVE BIO SURGEON STRL SZ 6.5 (GLOVE) ×4 IMPLANT
GLOVE BIOGEL PI IND STRL 6.5 (GLOVE) ×4 IMPLANT
GOWN STRL REUS W/ TWL LRG LVL3 (GOWN DISPOSABLE) ×6 IMPLANT
GOWN STRL REUS W/TWL LRG LVL3 (GOWN DISPOSABLE) ×6
GRASPER SUT TROCAR 14GX15 (MISCELLANEOUS) ×2 IMPLANT
IRRIGATOR SUCT 8 DISP DVNC XI (IRRIGATION / IRRIGATOR) IMPLANT
IRRIGATOR SUCTION 8MM XI DISP (IRRIGATION / IRRIGATOR)
IV CATH ANGIO 12GX3 LT BLUE (NEEDLE) IMPLANT
IV NS 1000ML (IV SOLUTION)
IV NS 1000ML BAXH (IV SOLUTION) IMPLANT
KIT PINK PAD W/HEAD ARE REST (MISCELLANEOUS) ×2 IMPLANT
KIT PINK PAD W/HEAD ARM REST (MISCELLANEOUS) ×2 IMPLANT
LABEL OR SOLS (LABEL) ×2 IMPLANT
MANIFOLD NEPTUNE II (INSTRUMENTS) ×2 IMPLANT
NDL INSUFFLATION 14GA 120MM (NEEDLE) ×2 IMPLANT
NEEDLE HYPO 22GX1.5 SAFETY (NEEDLE) ×2 IMPLANT
NEEDLE INSUFFLATION 14GA 120MM (NEEDLE) ×2 IMPLANT
NS IRRIG 500ML POUR BTL (IV SOLUTION) ×2 IMPLANT
OBTURATOR OPTICAL STANDARD 8MM (TROCAR) ×2
OBTURATOR OPTICAL STND 8 DVNC (TROCAR) ×2
OBTURATOR OPTICALSTD 8 DVNC (TROCAR) ×2 IMPLANT
PACK LAP CHOLECYSTECTOMY (MISCELLANEOUS) ×2 IMPLANT
SEAL CANN UNIV 5-8 DVNC XI (MISCELLANEOUS) ×6 IMPLANT
SEAL XI 5MM-8MM UNIVERSAL (MISCELLANEOUS) ×8
SET TUBE SMOKE EVAC HIGH FLOW (TUBING) ×2 IMPLANT
SOLUTION ELECTROLUBE (MISCELLANEOUS) ×2 IMPLANT
SPIKE FLUID TRANSFER (MISCELLANEOUS) ×4 IMPLANT
SPONGE T-LAP 4X18 ~~LOC~~+RFID (SPONGE) IMPLANT
STAPLER CANNULA SEAL DVNC XI (STAPLE) ×2 IMPLANT
STAPLER CANNULA SEAL XI (STAPLE) ×2
SUT MNCRL 4-0 (SUTURE) ×2
SUT MNCRL 4-0 27XMFL (SUTURE) ×2
SUT VICRYL 0 AB UR-6 (SUTURE) ×2 IMPLANT
SUTURE MNCRL 4-0 27XMF (SUTURE) ×2 IMPLANT
SYS BAG RETRIEVAL 10MM (BASKET) ×2
SYSTEM BAG RETRIEVAL 10MM (BASKET) ×2 IMPLANT
TRAP FLUID SMOKE EVACUATOR (MISCELLANEOUS) ×2 IMPLANT
WATER STERILE IRR 500ML POUR (IV SOLUTION) ×2 IMPLANT

## 2022-02-19 NOTE — Anesthesia Preprocedure Evaluation (Signed)
Anesthesia Evaluation  Patient identified by MRN, date of birth, ID band Patient awake    Reviewed: Allergy & Precautions, NPO status , Patient's Chart, lab work & pertinent test results  History of Anesthesia Complications Negative for: history of anesthetic complications  Airway Mallampati: III  TM Distance: >3 FB Neck ROM: full    Dental  (+) Teeth Intact   Pulmonary neg pulmonary ROS, Current Smoker and Patient abstained from smoking.,    Pulmonary exam normal        Cardiovascular hypertension, Normal cardiovascular exam     Neuro/Psych  Headaches, PSYCHIATRIC DISORDERS Anxiety    GI/Hepatic Neg liver ROS, GERD  ,  Endo/Other  Morbid obesity  Renal/GU      Musculoskeletal   Abdominal   Peds  Hematology negative hematology ROS (+)   Anesthesia Other Findings Past Medical History: No date: Anxiety No date: Family history of adverse reaction to anesthesia     Comment:  family with PONV No date: GERD (gastroesophageal reflux disease) No date: Headache  Past Surgical History: 05/30/2021: CERVICAL DISC ARTHROPLASTY; N/A     Comment:  Procedure: C5-6 CERVICAL ANTERIOR DISC ARTHROPLASTY;                Surgeon: Meade Maw, MD;  Location: [facility] ORS;                Service: Neurosurgery;  Laterality: N/A; No date: TUBAL LIGATION No date: WISDOM TOOTH EXTRACTION  BMI    Body Mass Index: 46.02 kg/m      Reproductive/Obstetrics negative OB ROS                             Anesthesia Physical Anesthesia Plan  ASA: 3  Anesthesia Plan: General ETT   Post-op Pain Management: Tylenol PO (pre-op)*, Toradol IV (intra-op)* and Dilaudid IV   Induction: Intravenous  PONV Risk Score and Plan: 4 or greater and Ondansetron, Dexamethasone, Midazolam and Treatment may vary due to age or medical condition  Airway Management Planned: Oral ETT  Additional Equipment:   Intra-op Plan:    Post-operative Plan: Extubation in OR  Informed Consent: I have reviewed the patients History and Physical, chart, labs and discussed the procedure including the risks, benefits and alternatives for the proposed anesthesia with the patient or authorized representative who has indicated his/her understanding and acceptance.     Dental Advisory Given  Plan Discussed with: Anesthesiologist, CRNA and Surgeon  Anesthesia Plan Comments: (Patient consented for risks of anesthesia including but not limited to:  - adverse reactions to medications - damage to eyes, teeth, lips or other oral mucosa - nerve damage due to positioning  - sore throat or hoarseness - Damage to heart, brain, nerves, lungs, other parts of body or loss of life  Patient voiced understanding.)        Anesthesia Quick Evaluation

## 2022-02-19 NOTE — H&P (View-Only) (Signed)
PATIENT PROFILE: Alicia Burns is a 41 y.o. female who presents to the Clinic for consultation at the request of Dr. Beather Arbour for evaluation of cholelithiasis.  PCP:  Donnamarie Rossetti, PA  HISTORY OF PRESENT ILLNESS: Alicia Burns reports went to the ED yesterday due to right upper quadrant pain.  Pain mostly on the right upper quadrant of the abdomen.  Pain does not radiate to the part of the body.  Pain aggravated by eating.  She can identify any elevating factors.  At the ED she was found with mild leukocytosis.  Normal bilirubin and liver enzymes.  She had ultrasound of the abdomen that shows cholelithiasis without sign of cholecystitis.  I personally evaluated the images.   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   Right calf pain 06/20/2017   Essential hypertension 04/09/2017   Gastroesophageal reflux disease without esophagitis 04/09/2017   Intractable migraine with aura without status migrainosus 04/09/2017   Current smoker 07/16/2015   Headache 11/24/2014   Venous (peripheral) insufficiency 09/14/2013    GENERAL REVIEW OF SYSTEMS:   General ROS: negative for - chills, fatigue, fever, weight gain or weight loss Allergy and Immunology ROS: negative for - hives  Hematological and Lymphatic ROS: negative for - bleeding problems or bruising, negative for palpable nodes Endocrine ROS: negative for - heat or cold intolerance, hair changes Respiratory ROS: negative for - cough, shortness of breath or wheezing Cardiovascular ROS: no chest pain or palpitations GI ROS: negative for nausea, vomiting, diarrhea, constipation.  Positive for abdominal pain Musculoskeletal ROS: negative for - joint swelling or muscle pain Neurological ROS: negative for - confusion, syncope Dermatological ROS: negative for pruritus and rash Psychiatric: negative for anxiety, depression, difficulty sleeping and memory  loss  MEDICATIONS: Current Outpatient Medications  Medication Sig Dispense Refill   celecoxib (CELEBREX) 100 MG capsule Take 1 capsule (100 mg total) by mouth 2 (two) times daily 120 capsule 0   hydroCHLOROthiazide (HYDRODIURIL) 25 MG tablet TAKE ONE TABLET BY MOUTH ONE TIME DAILY 90 tablet 3   pantoprazole (PROTONIX) 40 MG DR tablet TAKE ONE TABLET BY MOUTH ONE TIME DAILY 90 tablet 3   propranoloL (INDERAL) 10 MG tablet TAKE ONE TABLET BY MOUTH TWICE A DAY 60 tablet 11   SUMAtriptan (IMITREX) 50 MG tablet TAKE 1/2 TABLET AT HEADACHE ONSET, MAY TAKE A SECOND DOSE AFTER 2 HOURS IF NEEDED 9 tablet 0   pen needle, diabetic 31 gauge x 5/16" needle Use as directed (Patient not taking: Reported on 01/25/2022) 100 each 12   SAXENDA 3 mg/0.5 mL (18 mg/3 mL) pen injector INJECT 0.6MG  SUBCUTANEOUSLY ONCE DAILY FOR 7 DAYS, THEN 1.2MG  ONCE DAILY FOR 7 DAYS, THEN 1.8MG  ONCE DAILY FOR 7 DAYS, THEN 2.4MG  ONCE DAILY FOR 7 DAYS, THEN 3MG  ONCE DAILY FOR 7 DAYS (Patient not taking: Reported on 01/25/2022) 15 mL 0   No current facility-administered medications for this visit.    ALLERGIES: Codeine sulfate, Nortriptyline, and Vicodin [hydrocodone-acetaminophen]  PAST MEDICAL HISTORY: Past Medical History:  Diagnosis Date   Chickenpox    GERD (gastroesophageal reflux disease)    Migraine     PAST SURGICAL HISTORY: Past Surgical History:  Procedure Laterality Date   TUBAL LIGATION  12/21/2003   C5-6 CERVICAL ANTERIOR DISC ARTHROPLASTY  05/30/2021   Dr. Meade Maw at [facility] [place]   wisdom teeth removed 04/2004  FAMILY HISTORY: Family History  Problem Relation Age of Onset   Diabetes type II Father    High blood pressure (Hypertension) Father    No Known Problems Mother      SOCIAL HISTORY: Social History   Socioeconomic History   Marital status: Married   Number of children: 2   Years of education: 14  Tobacco Use   Smoking status: Every Day    Packs/day: .5    Types: Cigarettes    Smokeless tobacco: Never  Vaping Use   Vaping Use: Never used  Substance and Sexual Activity   Alcohol use: Never    Alcohol/week: 0.0 standard drinks of alcohol   Drug use: Never   Sexual activity: Defer    Partners: Male    Birth control/protection: Surgical    PHYSICAL EXAM: There were no vitals filed for this visit. There is no height or weight on file to calculate BMI.     GENERAL: Alert, active, oriented x3  HEENT: Pupils equal reactive to light. Extraocular movements are intact. Sclera clear. Palpebral conjunctiva normal red color.Pharynx clear.  NECK: Supple with no palpable mass and no adenopathy.  LUNGS: Sound clear with no rales rhonchi or wheezes.  HEART: Regular rhythm S1 and S2 without murmur.  ABDOMEN: Soft and depressible, nontender with no palpable mass, no hepatomegaly.   EXTREMITIES: Well-developed well-nourished symmetrical with no dependent edema.  NEUROLOGICAL: Awake alert oriented, facial expression symmetrical, moving all extremities.  REVIEW OF DATA: I have reviewed the following data today: Office Visit on 01/25/2022  Component Date Value   Influenza A PCR 01/25/2022 Negative    Influenza B PCR 01/25/2022 Negative    RSV PCR 01/25/2022 Negative    SARS-CoV2 PCR 01/25/2022 Negative    Xpress Strep A, PCR 01/25/2022 Not Detected   Appointment on 12/20/2021  Component Date Value   Influenza A PCR 12/20/2021 Negative    Influenza B PCR 12/20/2021 Negative    RSV PCR 12/20/2021 Negative    SARS-CoV2 PCR 12/20/2021 Negative      ASSESSMENT: Alicia Burns is a 41 y.o. female presenting for consultation for cholelithiasis.    Patient was oriented about the diagnosis of cholelithiasis. Also oriented about what is the gallbladder, its anatomy and function and the implications of having stones / gallbladder low ejection fraction. The patient was oriented about the treatment alternatives (observation vs cholecystectomy). Patient was oriented that a  low percentage of patient will continue to have similar pain symptoms even after the gallbladder is removed. Surgical technique (open vs laparoscopic) was discussed. It was also discussed the goals of the surgery (decrease the pain episodes and avoid the risk of cholecystitis) and the risk of surgery including: bleeding, infection, common bile duct injury, stone retention, injury to other organs such as bowel, liver, stomach, other complications such as hernia, bowel obstruction among others. Also discussed with patient about anesthesia and its complications such as: reaction to medications, pneumonia, heart complications, death, among others.   Due to persistent pain in the right upper quadrant after discharge from ED last night I recommend to proceed with cholecystectomy today.  Patient n.p.o.  We will schedule for robotic assisted laparoscopic cholecystectomy today.  Cholecystitis, acute [K81.0]  PLAN: 1.  Robotic assisted laparoscopic cholecystectomy  Patient and and her daughter verbalized understanding, all questions were answered, and were agreeable with the plan outlined above.     Carolan Shiver, MD  Electronically signed by Carolan Shiver, MD

## 2022-02-19 NOTE — Anesthesia Postprocedure Evaluation (Signed)
Anesthesia Post Note  Patient: Alicia Burns  Procedure(s) Performed: XI ROBOTIC ASSISTED LAPAROSCOPIC CHOLECYSTECTOMY (Abdomen) INDOCYANINE GREEN FLUORESCENCE IMAGING (ICG)  Patient location during evaluation: PACU Anesthesia Type: General Level of consciousness: awake and alert Pain management: pain level controlled Vital Signs Assessment: post-procedure vital signs reviewed and stable Respiratory status: spontaneous breathing, nonlabored ventilation, respiratory function stable and patient connected to nasal cannula oxygen Cardiovascular status: blood pressure returned to baseline and stable Postop Assessment: no apparent nausea or vomiting Anesthetic complications: yes   Encounter Notable Events  Notable Event Outcome Phase Comment  Difficult to intubate - expected  Intraprocedure Filed from anesthesia note documentation.     Last Vitals:  Vitals:   02/19/22 1430 02/19/22 1440  BP: 116/69   Pulse: 77 73  Resp: 19 13  Temp:    SpO2: 92% 100%    Last Pain:  Vitals:   02/19/22 1426  TempSrc:   PainSc: Asleep                 Ilene Qua

## 2022-02-19 NOTE — H&P (Signed)
PATIENT PROFILE: Alicia Burns is a 41 y.o. female who presents to the Clinic for consultation at the request of Dr. Beather Arbour for evaluation of cholelithiasis.  PCP:  Donnamarie Rossetti, PA  HISTORY OF PRESENT ILLNESS: Alicia Burns reports went to the ED yesterday due to right upper quadrant pain.  Pain mostly on the right upper quadrant of the abdomen.  Pain does not radiate to the part of the body.  Pain aggravated by eating.  She can identify any elevating factors.  At the ED she was found with mild leukocytosis.  Normal bilirubin and liver enzymes.  She had ultrasound of the abdomen that shows cholelithiasis without sign of cholecystitis.  I personally evaluated the images.   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   Right calf pain 06/20/2017   Essential hypertension 04/09/2017   Gastroesophageal reflux disease without esophagitis 04/09/2017   Intractable migraine with aura without status migrainosus 04/09/2017   Current smoker 07/16/2015   Headache 11/24/2014   Venous (peripheral) insufficiency 09/14/2013    GENERAL REVIEW OF SYSTEMS:   General ROS: negative for - chills, fatigue, fever, weight gain or weight loss Allergy and Immunology ROS: negative for - hives  Hematological and Lymphatic ROS: negative for - bleeding problems or bruising, negative for palpable nodes Endocrine ROS: negative for - heat or cold intolerance, hair changes Respiratory ROS: negative for - cough, shortness of breath or wheezing Cardiovascular ROS: no chest pain or palpitations GI ROS: negative for nausea, vomiting, diarrhea, constipation.  Positive for abdominal pain Musculoskeletal ROS: negative for - joint swelling or muscle pain Neurological ROS: negative for - confusion, syncope Dermatological ROS: negative for pruritus and rash Psychiatric: negative for anxiety, depression, difficulty sleeping and memory  loss  MEDICATIONS: Current Outpatient Medications  Medication Sig Dispense Refill   celecoxib (CELEBREX) 100 MG capsule Take 1 capsule (100 mg total) by mouth 2 (two) times daily 120 capsule 0   hydroCHLOROthiazide (HYDRODIURIL) 25 MG tablet TAKE ONE TABLET BY MOUTH ONE TIME DAILY 90 tablet 3   pantoprazole (PROTONIX) 40 MG DR tablet TAKE ONE TABLET BY MOUTH ONE TIME DAILY 90 tablet 3   propranoloL (INDERAL) 10 MG tablet TAKE ONE TABLET BY MOUTH TWICE A DAY 60 tablet 11   SUMAtriptan (IMITREX) 50 MG tablet TAKE 1/2 TABLET AT HEADACHE ONSET, MAY TAKE A SECOND DOSE AFTER 2 HOURS IF NEEDED 9 tablet 0   pen needle, diabetic 31 gauge x 5/16" needle Use as directed (Patient not taking: Reported on 01/25/2022) 100 each 12   SAXENDA 3 mg/0.5 mL (18 mg/3 mL) pen injector INJECT 0.6MG  SUBCUTANEOUSLY ONCE DAILY FOR 7 DAYS, THEN 1.2MG  ONCE DAILY FOR 7 DAYS, THEN 1.8MG  ONCE DAILY FOR 7 DAYS, THEN 2.4MG  ONCE DAILY FOR 7 DAYS, THEN 3MG  ONCE DAILY FOR 7 DAYS (Patient not taking: Reported on 01/25/2022) 15 mL 0   No current facility-administered medications for this visit.    ALLERGIES: Codeine sulfate, Nortriptyline, and Vicodin [hydrocodone-acetaminophen]  PAST MEDICAL HISTORY: Past Medical History:  Diagnosis Date   Chickenpox    GERD (gastroesophageal reflux disease)    Migraine     PAST SURGICAL HISTORY: Past Surgical History:  Procedure Laterality Date   TUBAL LIGATION  12/21/2003   C5-6 CERVICAL ANTERIOR DISC ARTHROPLASTY  05/30/2021   Dr. Meade Maw at [facility], [place]   wisdom teeth removed 04/2004  FAMILY HISTORY: Family History  Problem Relation Age of Onset   Diabetes type II Father    High blood pressure (Hypertension) Father    No Known Problems Mother      SOCIAL HISTORY: Social History   Socioeconomic History   Marital status: Married   Number of children: 2   Years of education: 14  Tobacco Use   Smoking status: Every Day    Packs/day: .5    Types: Cigarettes    Smokeless tobacco: Never  Vaping Use   Vaping Use: Never used  Substance and Sexual Activity   Alcohol use: Never    Alcohol/week: 0.0 standard drinks of alcohol   Drug use: Never   Sexual activity: Defer    Partners: Male    Birth control/protection: Surgical    PHYSICAL EXAM: There were no vitals filed for this visit. There is no height or weight on file to calculate BMI.     GENERAL: Alert, active, oriented x3  HEENT: Pupils equal reactive to light. Extraocular movements are intact. Sclera clear. Palpebral conjunctiva normal red color.Pharynx clear.  NECK: Supple with no palpable mass and no adenopathy.  LUNGS: Sound clear with no rales rhonchi or wheezes.  HEART: Regular rhythm S1 and S2 without murmur.  ABDOMEN: Soft and depressible, nontender with no palpable mass, no hepatomegaly.   EXTREMITIES: Well-developed well-nourished symmetrical with no dependent edema.  NEUROLOGICAL: Awake alert oriented, facial expression symmetrical, moving all extremities.  REVIEW OF DATA: I have reviewed the following data today: Office Visit on 01/25/2022  Component Date Value   Influenza A PCR 01/25/2022 Negative    Influenza B PCR 01/25/2022 Negative    RSV PCR 01/25/2022 Negative    SARS-CoV2 PCR 01/25/2022 Negative    Xpress Strep A, PCR 01/25/2022 Not Detected   Appointment on 12/20/2021  Component Date Value   Influenza A PCR 12/20/2021 Negative    Influenza B PCR 12/20/2021 Negative    RSV PCR 12/20/2021 Negative    SARS-CoV2 PCR 12/20/2021 Negative      ASSESSMENT: Alicia Burns is a 41 y.o. female presenting for consultation for cholelithiasis.    Patient was oriented about the diagnosis of cholelithiasis. Also oriented about what is the gallbladder, its anatomy and function and the implications of having stones / gallbladder low ejection fraction. The patient was oriented about the treatment alternatives (observation vs cholecystectomy). Patient was oriented that a  low percentage of patient will continue to have similar pain symptoms even after the gallbladder is removed. Surgical technique (open vs laparoscopic) was discussed. It was also discussed the goals of the surgery (decrease the pain episodes and avoid the risk of cholecystitis) and the risk of surgery including: bleeding, infection, common bile duct injury, stone retention, injury to other organs such as bowel, liver, stomach, other complications such as hernia, bowel obstruction among others. Also discussed with patient about anesthesia and its complications such as: reaction to medications, pneumonia, heart complications, death, among others.   Due to persistent pain in the right upper quadrant after discharge from ED last night I recommend to proceed with cholecystectomy today.  Patient n.p.o.  We will schedule for robotic assisted laparoscopic cholecystectomy today.  Cholecystitis, acute [K81.0]  PLAN: 1.  Robotic assisted laparoscopic cholecystectomy  Patient and and her daughter verbalized understanding, all questions were answered, and were agreeable with the plan outlined above.     Carolan Shiver, MD  Electronically signed by Carolan Shiver, MD

## 2022-02-19 NOTE — Transfer of Care (Signed)
Immediate Anesthesia Transfer of Care Note  Patient: Sloan Beckie Salts  Procedure(s) Performed: XI ROBOTIC ASSISTED LAPAROSCOPIC CHOLECYSTECTOMY (Abdomen) INDOCYANINE GREEN FLUORESCENCE IMAGING (ICG)  Patient Location: PACU  Anesthesia Type:General  Level of Consciousness: drowsy  Airway & Oxygen Therapy: Patient Spontanous Breathing and Patient connected to face mask oxygen  Post-op Assessment: Report given to RN and Post -op Vital signs reviewed and stable  Post vital signs: stable  Last Vitals:  Vitals Value Taken Time  BP 119/59 02/19/22 1426  Temp 36.7 C 02/19/22 1426  Pulse 77 02/19/22 1429  Resp 19 02/19/22 1429  SpO2 92 % 02/19/22 1429  Vitals shown include unvalidated device data.  Last Pain:  Vitals:   02/19/22 1041  TempSrc: Temporal  PainSc: 0-No pain         Complications:  Encounter Notable Events  Notable Event Outcome Phase Comment  Difficult to intubate - expected  Intraprocedure Filed from anesthesia note documentation.

## 2022-02-19 NOTE — Op Note (Signed)
Preoperative diagnosis: Acute cholecystitis  Postoperative diagnosis: Same  Procedure: Robotic Assisted Laparoscopic Cholecystectomy.   Anesthesia: GETA   Surgeon: Dr. Windell Moment  Wound Classification: Clean Contaminated  Indications: Patient is a 41 y.o. female developed right upper quadrant pain, leukocytosis and on workup was found to have cholelithiasis with a normal common duct. Robotic Assisted Laparoscopic cholecystectomy was elected.  Findings: Pericholecystic edema Critical view of safety achieved Cystic duct and artery identified, ligated and divided Adequate hemostasis     Description of procedure: The patient was placed on the operating table in the supine position. General anesthesia was induced. A time-out was completed verifying correct patient, procedure, site, positioning, and implant(s) and/or special equipment prior to beginning this procedure. An orogastric tube was placed. The abdomen was prepped and draped in the usual sterile fashion.  An incision was made in a natural skin line below the umbilicus.  The fascia was elevated and the Veress needle inserted. Proper position was confirmed by aspiration and saline meniscus test.  The abdomen was insufflated with carbon dioxide to a pressure of 15 mmHg. The patient tolerated insufflation well. A 8-mm trocar was then inserted in optiview fashion.  The laparoscope was inserted and the abdomen inspected. No injuries from initial trocar placement were noted. Additional trocars were then inserted in the following locations: an 8-mm trocar in the left lateral abdomen, and another two 8-mm trocars to the right side of the abdomen 5 cm appart. The umbilical trocar was changed to a 12 mm trocar all under direct visualization. The abdomen was inspected and no abnormalities were found. The table was placed in the reverse Trendelenburg position with the right side up. The robotic arms were docked and target anatomy identified.  Instrument inserted under direct visualization.  Filmy adhesions between the gallbladder and omentum, duodenum and transverse colon were lysed with electrocautery. The dome of the gallbladder was grasped with a prograsp and retracted over the dome of the liver. The infundibulum was also grasped with an atraumatic grasper and retracted toward the right lower quadrant. This maneuver exposed Calot's triangle. The peritoneum overlying the gallbladder infundibulum was then incised and the cystic duct and cystic artery identified and circumferentially dissected. Critical view of safety reviewed before ligating any structure. Firefly images taken to visualize biliary ducts. The cystic duct and cystic artery were then doubly clipped and divided close to the gallbladder.  The gallbladder was then dissected from its peritoneal attachments by electrocautery. Hemostasis was checked and the gallbladder and contained stones were removed using an endoscopic retrieval bag. The gallbladder was passed off the table as a specimen.  There was no evidence of bleeding from the gallbladder fossa or cystic artery or leakage of the bile from the cystic duct stump. Secondary trocars were removed under direct vision. No bleeding was noted. The robotic arms were undoked. The scope was withdrawn and the umbilical trocar removed. The abdomen was allowed to collapse. The fascia of the 35mm trocar sites was closed with figure-of-eight 0 vicryl sutures. The skin was closed with subcuticular sutures of 4-0 monocryl and topical skin adhesive. The orogastric tube was removed.  The patient tolerated the procedure well and was taken to the postanesthesia care unit in stable condition.   Specimen: Gallbladder  Complications: None  EBL: 5 mL

## 2022-02-19 NOTE — Discharge Instructions (Addendum)
  Diet: Resume home heart healthy regular diet.   Activity: No heavy lifting >20 pounds (children, pets, laundry, garbage) or strenuous activity until follow-up, but light activity and walking are encouraged. Do not drive or drink alcohol if taking narcotic pain medications.  Wound care: May shower with soapy water and pat dry (do not rub incisions), but no baths or submerging incision underwater until follow-up. (no swimming)   Medications: Resume all home medications. For mild to moderate pain: acetaminophen (Tylenol) ***or ibuprofen (if no kidney disease). Combining Tylenol with alcohol can substantially increase your risk of causing liver disease. Narcotic pain medications, if prescribed, can be used for severe pain, though may cause nausea, constipation, and drowsiness. Do not combine Tylenol and Norco within a 6 hour period as Norco contains Tylenol. If you do not need the narcotic pain medication, you do not need to fill the prescription.  Call office ([phone]) at any time if any questions, worsening pain, fevers/chills, bleeding, drainage from incision site, or other concerns.   AMBULATORY SURGERY  DISCHARGE INSTRUCTIONS   The drugs that you were given will stay in your system until tomorrow so for the next 24 hours you should not:  Drive an automobile Make any legal decisions Drink any alcoholic beverage   You may resume regular meals tomorrow.  Today it is better to start with liquids and gradually work up to solid foods.  You may eat anything you prefer, but it is better to start with liquids, then soup and crackers, and gradually work up to solid foods.   Please notify your doctor immediately if you have any unusual bleeding, trouble breathing, redness and pain at the surgery site, drainage, fever, or pain not relieved by medication.    Additional Instructions:        Please contact your physician with any problems or Same Day Surgery at [phone], Monday  through Friday 6 am to 4 pm, or [place] [facility] [place] Main number at [phone].  

## 2022-02-19 NOTE — Anesthesia Procedure Notes (Signed)
Procedure Name: Intubation Date/Time: 02/19/2022 1:04 PM  Performed by: Natasha Mead, CRNAPre-anesthesia Checklist: Patient identified, Emergency Drugs available, Suction available and Patient being monitored Patient Re-evaluated:Patient Re-evaluated prior to induction Oxygen Delivery Method: Circle system utilized Preoxygenation: Pre-oxygenation with 100% oxygen Induction Type: IV induction Ventilation: Mask ventilation without difficulty Laryngoscope Size: McGraph and 3 Grade View: Grade I Tube type: Oral Number of attempts: 2 (grade 1 view but difficulty getting ett into cords, with increased curvature of stylet was able to place ett) Airway Equipment and Method: Stylet and Oral airway Placement Confirmation: ETT inserted through vocal cords under direct vision, positive ETCO2 and breath sounds checked- equal and bilateral Tube secured with: Tape Dental Injury: Teeth and Oropharynx as per pre-operative assessment  Difficulty Due To: Difficulty was anticipated

## 2022-02-19 NOTE — ED Triage Notes (Signed)
Pt presents via POV with complaints of lower abdominal pain with associated lower back pain that started yesterday. Pt notes taking her antiacid medication and mylanta PTA without any improvement. States her pain is "sharp" 10/10. Denies N/V/D, CP or SOB.

## 2022-02-19 NOTE — Discharge Instructions (Signed)
1. Take medicines as needed for pain & nausea (Percocet/Zofran #30). 2.  Follow the gallbladder eating plan. Avoid fatty, greasy, spicy foods and drinks. 3. Return to the ER for worsening symptoms, persistent vomiting, fever, difficulty breathing or other concerns.

## 2022-02-19 NOTE — Interval H&P Note (Signed)
History and Physical Interval Note:  02/19/2022 3:51 PM  Alicia Burns  has presented today for surgery, with the diagnosis of K81.0 Acute Cholecystitis.  The various methods of treatment have been discussed with the patient and family. After consideration of risks, benefits and other options for treatment, the patient has consented to  Procedure(s): XI ROBOTIC ASSISTED LAPAROSCOPIC CHOLECYSTECTOMY (N/A) [place] (ICG) (N/A) as a surgical intervention.  The patient's history has been reviewed, patient examined, no change in status, stable for surgery.  I have reviewed the patient's chart and labs.  Questions were answered to the patient's satisfaction.     Herbert Pun

## 2022-02-19 NOTE — ED Provider Notes (Signed)
[facility] Provider Note    Event Date/Time   First MD Initiated Contact with Patient 02/19/22 0145     (approximate)   History   Abdominal Pain   HPI  Alicia Burns is a 41 y.o. female who presents to the ED from home with a chief complaint of upper abdominal pain.  Pain began in her epigastrium and radiating towards her right upper quadrant and flank starting around 5 PM.  Exacerbated with eating spaghetti dinner.  Endorses nausea without vomiting.  Denies fever/chills, cough, chest pain, shortness of breath, diarrhea.     Past Medical History   Past Medical History:  Diagnosis Date  . Anxiety   . Family history of adverse reaction to anesthesia    family with PONV  . GERD (gastroesophageal reflux disease)   . Headache      Active Problem List  There are no problems to display for this patient.    Past Surgical History   Past Surgical History:  Procedure Laterality Date  . CERVICAL DISC ARTHROPLASTY N/A 05/30/2021   Procedure: C5-6 CERVICAL ANTERIOR DISC ARTHROPLASTY;  Surgeon: Meade Maw, MD;  Location: [facility] ORS;  Service: Neurosurgery;  Laterality: N/A;  . TUBAL LIGATION    . WISDOM TOOTH EXTRACTION       Home Medications   Prior to Admission medications   Medication Sig Start Date End Date Taking? Authorizing Provider  celecoxib (CELEBREX) 100 MG capsule Take 1 capsule (100 mg total) by mouth 2 (two) times daily. 05/30/21   Loleta Dicker, PA  hydrochlorothiazide (HYDRODIURIL) 25 MG tablet Take 25 mg by mouth daily. 05/11/21   [provider]  methocarbamol (ROBAXIN) 500 MG tablet Take 1 tablet (500 mg total) by mouth every 6 (six) hours as needed for muscle spasms. 05/30/21   Loleta Dicker, PA  pantoprazole (PROTONIX) 40 MG tablet Take 40 mg by mouth daily. 02/07/21   [provider]  propranolol (INDERAL) 10 MG tablet Take 10 mg by mouth daily. 05/03/21   [provider]  senna (SENOKOT)  8.6 MG TABS tablet Take 1 tablet (8.6 mg total) by mouth daily as needed for mild constipation. 05/30/21   Loleta Dicker, PA  SUMAtriptan (IMITREX) 50 MG tablet Take 50 mg by mouth daily as needed for migraine. 04/10/21   [provider]     Allergies  Nortriptyline and Hydrocodone-acetaminophen   Family History  No family history on file.   Physical Exam  Triage Vital Signs: ED Triage Vitals  Enc Vitals Group     BP --      Pulse Rate 02/19/22 0143 69     Resp 02/19/22 0143 18     Temp 02/19/22 0143 97.7 F (36.5 C)     Temp Source 02/19/22 0143 Oral     SpO2 02/19/22 0143 100 %     Weight 02/19/22 0145 268 lb (121.6 kg)     Height 02/19/22 0145 5\' 4"  (1.626 m)     Head Circumference --      Peak Flow --      Pain Score 02/19/22 0145 10     Pain Loc --      Pain Edu? --      Excl. in [place]? --     Updated Vital Signs: BP (!) 146/86   Pulse 74   Temp 97.7 F (36.5 C) (Oral)   Resp 19   Ht 5\' 4"  (1.626 m)   Wt 121.6 kg  SpO2 91%   BMI 46.00 kg/m    General: Awake, mild to moderate distress.  CV:  RRR.  Good peripheral perfusion.  Resp:  Normal effort.  CTA B. Abd:  Moderately tender to epigastrium and right upper quadrant without rebound or guarding.  No distention.  Other:  No truncal vesicles.   ED Results / Procedures / Treatments  Labs (all labs ordered are listed, but only abnormal results are displayed) Labs Reviewed  CBC WITH DIFFERENTIAL/PLATELET - Abnormal; Notable for the following components:      Result Value   WBC 13.4 (*)    Neutro Abs 9.9 (*)    Abs Immature Granulocytes 0.08 (*)    All other components within normal limits  COMPREHENSIVE METABOLIC PANEL - Abnormal; Notable for the following components:   Glucose, Bld 141 (*)    All other components within normal limits  LIPASE, BLOOD     EKG  ED ECG REPORT I, Shaelynn Dragos J, the attending physician, personally viewed and interpreted this ECG.   Date: 02/19/2022  EKG  Time: 0255  Rate: 61  Rhythm: normal sinus rhythm  Axis: Normal  Intervals:none  ST&T Change: Nonspecific    RADIOLOGY I have independently visualized and interpreted patient's ultrasound as well as noted the radiology interpretation:  RUQ ultrasound: Cholelithiasis  Official radiology report(s): US ABDOMEN LIMITED RUQ (LIVER/GB)  Result Date: 02/19/2022 CLINICAL DATA:  Right upper quadrant pain for 2 days, initial encounter EXAM: ULTRASOUND ABDOMEN LIMITED RIGHT UPPER QUADRANT COMPARISON:  10/31/2021 FINDINGS: Gallbladder: Gallbladder is well distended with evidence of cholelithiasis. No wall thickening or pericholecystic fluid is noted. Common bile duct: Diameter: 4.9 mm. Liver: Mildly increased in echogenicity consistent with fatty infiltration. Portal vein is patent on color Doppler imaging with normal direction of blood flow towards the liver. Other: None. IMPRESSION: Cholelithiasis without complicating factors. Fatty liver. No other focal abnormality is noted. Electronically Signed   By: Alcide Clever M.D.   On: 02/19/2022 03:03     PROCEDURES:  Critical Care performed: No  .1-3 Lead EKG Interpretation  Performed by: Irean Hong, MD Authorized by: Irean Hong, MD     Interpretation: normal     ECG rate:  70   ECG rate assessment: normal     Rhythm: sinus rhythm     Ectopy: none     Conduction: normal   Comments:     Patient placed on cardiac monitor to evaluate for arrhythmias    MEDICATIONS ORDERED IN ED: Medications  sodium chloride 0.9 % bolus 500 mL (0 mLs Intravenous Stopped 02/19/22 0319)  ondansetron (ZOFRAN) injection 4 mg (4 mg Intravenous Given 02/19/22 0224)  HYDROmorphone (DILAUDID) injection 0.5 mg (0.5 mg Intravenous Given 02/19/22 0224)  famotidine (PEPCID) IVPB 20 mg premix (0 mg Intravenous Stopped 02/19/22 0319)     IMPRESSION / MDM / ASSESSMENT AND PLAN / ED COURSE  I reviewed the triage vital signs and the nursing notes.                              41 year old female presenting with upper abdominal pain and nausea. Differential diagnosis includes, but is not limited to, biliary disease (biliary colic, acute cholecystitis, cholangitis, choledocholithiasis, etc), intrathoracic causes for epigastric abdominal pain including ACS, gastritis, duodenitis, pancreatitis, small bowel or large bowel obstruction, abdominal aortic aneurysm, hernia, and ulcer(s).  I have personally reviewed patient's records and note a PCP office visit on 01/25/2022 for sinusitis.  Patient's presentation is most consistent with acute presentation with potential threat to life or bodily function.  The patient is on the cardiac monitor to evaluate for evidence of arrhythmia and/or significant heart rate changes.  We will obtain upper abdominal panel, right upper quadrant abdominal ultrasound to evaluate for cholecystitis.  Keep patient NPO, administer IV fluids, IV Dilaudid for pain paired with IV Zofran for nausea; add IV Pepcid.  Will reassess.  Clinical Course as of 02/19/22 0320  Tue Feb 19, 2022  0319 Updated patient and spouse on laboratory results and ultrasound.  She is feeling better after IV Dilaudid.  At this time will add IV Toradol and Percocet.  Will refer to general surgery for outpatient follow-up.  Strict return precautions given.  Both verbalized understanding agree with plan of care. [JS]    Clinical Course User Index [JS] Irean Hong, MD     FINAL CLINICAL IMPRESSION(S) / ED DIAGNOSES   Final diagnoses:  Right upper quadrant abdominal pain  Calculus of gallbladder without cholecystitis without obstruction  Biliary colic     Rx / DC Orders   ED Discharge Orders     None        Note:  This document was prepared using Dragon voice recognition software and may include unintentional dictation errors.   Irean Hong, MD 02/19/22 [id number]

## 2022-02-21 LAB — SURGICAL PATHOLOGY

## 2022-03-15 ENCOUNTER — Other Ambulatory Visit: Payer: Self-pay

## 2022-03-15 DIAGNOSIS — G959 Disease of spinal cord, unspecified: Secondary | ICD-10-CM

## 2022-03-19 ENCOUNTER — Ambulatory Visit: Payer: Managed Care, Other (non HMO) | Admitting: Neurosurgery

## 2022-03-25 ENCOUNTER — Ambulatory Visit
Admission: RE | Admit: 2022-03-25 | Discharge: 2022-03-25 | Disposition: A | Discharge status: Home / Self Care | Payer: Managed Care, Other (non HMO) | Source: Ambulatory Visit | Attending: Neurosurgery | Admitting: Neurosurgery

## 2022-03-25 DIAGNOSIS — G959 Disease of spinal cord, unspecified: Secondary | ICD-10-CM | POA: Insufficient documentation

## 2022-03-26 ENCOUNTER — Ambulatory Visit (INDEPENDENT_AMBULATORY_CARE_PROVIDER_SITE_OTHER): Payer: Managed Care, Other (non HMO) | Admitting: Neurosurgery

## 2022-03-26 ENCOUNTER — Encounter: Payer: Self-pay | Admitting: Neurosurgery

## 2022-03-26 VITALS — BP 169/99 | HR 93 | Wt 267.0 lb

## 2022-03-26 DIAGNOSIS — M4802 Spinal stenosis, cervical region: Secondary | ICD-10-CM | POA: Diagnosis not present

## 2022-03-26 DIAGNOSIS — Z09 Encounter for follow-up examination after completed treatment for conditions other than malignant neoplasm: Secondary | ICD-10-CM | POA: Diagnosis not present

## 2022-03-26 DIAGNOSIS — Z9889 Other specified postprocedural states: Secondary | ICD-10-CM

## 2022-03-26 DIAGNOSIS — G959 Disease of spinal cord, unspecified: Secondary | ICD-10-CM

## 2022-03-26 NOTE — Progress Notes (Signed)
   DOS: 06/11/21 (C5-6 arthroplasty)  HISTORY OF PRESENT ILLNESS: 03/26/2022 [name] is status post arthroplasty.  She occasionally gets some numbness in her left hand, but she is doing very well overall.Marland Kitchen   PHYSICAL EXAMINATION:   Vitals:   03/26/22 1031  BP: (!) 169/99  Pulse: 93   General: Patient is well developed, well nourished, calm, collected, and in no apparent distress.  NEUROLOGICAL:  General: In no acute distress.  Awake, alert, oriented to person, place, and time. Pupils equal round and reactive to light.   Strength: Side Biceps Triceps Deltoid Interossei Grip Wrist Ext. Wrist Flex.  R 5 5 5 5 5 5 5   L 5 5 5 5 5 5 5    Incision c/d/i   ROS (Neurologic): Negative except as noted above  IMAGING: No complications noted  ASSESSMENT/PLAN:  Alicia Burns is doing well after C5-6 arthroplasty.  I am very pleased with her response to surgery.  She is stable currently.  We will see her back on an as-needed basis.  I spent a total of 10 minutes in this patient's care today. This time was spent reviewing pertinent records including imaging studies, obtaining and confirming history, performing a directed evaluation, formulating and discussing my recommendations, and documenting the visit within the medical record.    MD, [facility] Department of Neurosurgery

## 2022-06-03 ENCOUNTER — Other Ambulatory Visit: Payer: Self-pay | Admitting: Family Medicine

## 2022-06-03 DIAGNOSIS — Z1231 Encounter for screening mammogram for malignant neoplasm of breast: Secondary | ICD-10-CM

## 2022-08-20 ENCOUNTER — Ambulatory Visit
Admission: RE | Admit: 2022-08-20 | Discharge: 2022-08-20 | Disposition: A | Discharge status: Home / Self Care | Payer: Managed Care, Other (non HMO) | Source: Ambulatory Visit | Attending: Family Medicine | Admitting: Family Medicine

## 2022-08-20 DIAGNOSIS — Z1231 Encounter for screening mammogram for malignant neoplasm of breast: Secondary | ICD-10-CM | POA: Diagnosis present

## 2023-06-13 ENCOUNTER — Other Ambulatory Visit: Payer: Self-pay | Admitting: Certified Nurse Midwife

## 2023-06-13 DIAGNOSIS — Z1231 Encounter for screening mammogram for malignant neoplasm of breast: Secondary | ICD-10-CM

## 2023-11-11 NOTE — Progress Notes (Signed)
 Chief Complaint  Patient presents with  . Follow-up    Unable to take phentermine Wants to discuss weight loss injections     Patient is agreeable to [employer] scribe.   History of Present Illness Alicia Burns is a 43 year old female with recurrent UTIs who presents for a three-month follow-up.  She has experienced recurrent urinary tract infections over the past few months. Initially, she had no typical UTI symptoms but developed a severe illness requiring urgent care, where she received antibiotics including a Rocephin shot and Omnicef (cefdinir). In April, she had another UTI, and in June, she experienced dysuria and a sensation of incomplete bladder emptying. She completed her antibiotic courses but cannot recall the specific medications. She is concerned about a potential prolapsed uterus, as her grandmother had a similar issue affecting her bladder.  She has a history of hypertension, managed with hydrochlorothiazide 25 mg once daily. She also has a history of GERD, managed with Protonix 40 mg once daily, and a history of migraines and tension headaches. She previously used propranolol but is not currently taking it. She has Imitrex available for acute migraine episodes.  She has a history of kidney stones, last occurring before 2014, with no recent episodes. She has undergone ultrasounds which showed no abnormalities. She is concerned about the possibility of urinary retention contributing to her recurrent UTIs.  She mentions weight gain and difficulty managing her weight, having previously tried phentermine but discontinued due to insomnia. She is actively trying to increase her water intake, especially after her recent UTI.  Her family history includes a grandmother with a prolapsed uterus and a father with diabetes and hypertension. She smokes and acknowledges the need to quit.    ROS Review of systems is unremarkable for any active cardiac, respiratory, GI, GU, hematologic,  neurologic, dermatologic, HEENT, or psychiatric symptoms except as noted above.  No fevers, chills, or constitutional symptoms.   Current Outpatient Medications  Medication Sig Dispense Refill  . hydroCHLOROthiazide (HYDRODIURIL) 25 MG tablet TAKE ONE TABLET BY MOUTH ONE TIME DAILY 90 tablet 3  . pantoprazole (PROTONIX) 40 MG DR tablet TAKE ONE TABLET BY MOUTH ONE TIME DAILY 90 tablet 3  . SUMAtriptan (IMITREX) 50 MG tablet Take 1/2 tablet at headache onset. May take a second dose after 2 hours if needed. 9 tablet 0  . propranoloL (INDERAL) 10 MG tablet TAKE ONE TABLET BY MOUTH TWICE A DAY (Patient not taking: Reported on 11/11/2023) 60 tablet 11  . semaglutide (WEGOVY) 0.25 mg/0.5 mL pen injector Inject 0.5 mLs (0.25 mg total) subcutaneously once a week for 30 days 2 mL 0   No current facility-administered medications for this visit.    Allergies as of 11/11/2023 - Reviewed 08/12/2023  Allergen Reaction Noted  . Nortriptyline Other (See Comments) 10/31/2021  . Vicodin [hydrocodone-acetaminophen ] Nausea 09/14/2013    Patient Active Problem List  Diagnosis  . Venous (peripheral) insufficiency  . Headache  . Current smoker  . Essential hypertension  . Gastroesophageal reflux disease without esophagitis  . Intractable migraine with aura without status migrainosus  . Right calf pain  . Cervical spondylosis with radiculopathy  . Common migraine  . Hematuria, unspecified  . Irritable bowel syndrome    Past Medical History:  Diagnosis Date  . Chickenpox   . GERD (gastroesophageal reflux disease)   . Migraine     Past Surgical History:  Procedure Laterality Date  . TUBAL LIGATION  12/21/2003  . C5-6 CERVICAL ANTERIOR DISC ARTHROPLASTY  05/30/2021   Dr. Reeves Daisy at [facility], LDR  . CHOLECYSTECTOMY     Oct 24th 2023  . wisdom teeth removed 04/2004      Vitals:   11/11/23 0831  BP: (!) 140/90  Pulse: 81  SpO2: 98%  Weight: (!) 125.6 kg (277 lb)  Height: 162.6 cm (5'  4)  PainSc: 0-No pain   Body mass index is 47.55 kg/m.  Exam  General. Well appearing; NAD; VS reviewed     Eyes. Sclera and conjunctiva clear; Vision grossly intact; extraocular movements intact Neck. Supple.  Lungs. Respirations unlabored; clear to auscultation bilaterally Cardiovascular. Heart regular rate and rhythm without murmurs, gallops, or rubs Abdomen. Soft; non tender; non distended; no masses or organomegaly Extremities. no edema Skin. Normal color and turgor Neurologic. Alert and oriented x3; CN 2-12 grossly intact; no focal deficits  Assessment & Plan  Recurrent Urinary Tract Infections (UTIs) Concern for urinary retention or anatomical issues contributing to recurrent UTIs. Family history of uterine prolapse noted. - Monitor for another UTI. If occurs, refer to urology for evaluation including bladder scan. - Treat future UTIs with antibiotics as needed.  Hypertension Blood pressure at 140/90 mmHg. Weight gain may contribute to elevated blood pressure. Potential weight loss with [facility] may help. - Continue hydrochlorothiazide 25 mg daily. - Monitor blood pressure closely, especially with weight loss interventions.  Obesity Weight gain reported. Interest in [facility] for weight management. Wegovy expected to aid in weight loss and hypertension management. - Send prescription for Wegovy to [facility] pharmacy. - Monitor weight and response to [facility], adjust dose after three weeks based on tolerance and effectiveness. - Encourage increased water intake to aid weight management and prevent constipation.  Gastroesophageal Reflux Disease (GERD) Managing GERD with pantoprazole.  Will continue to focus on weight loss and avoiding triggers. - Continue pantoprazole 40 mg daily.  Migraine Experiences migraines and tension headaches, using sumatriptan for acute management. - Continue sumatriptan as needed for acute migraines.  Smoking Cessation Continues to smoke despite  previous advice. - Advise smoking cessation.  Diabetes Screening Discussion about diabetes screening due to family history and current health status. Interest in [place] if diabetes diagnosed.  Prior A1c borderline elevated. - Order A1c test to assess for diabetes. - Discuss potential use of Mounjaro if A1c indicates diabetes.     F/U: Patient follow-up to be determined based on labs today.  JASON HESTLE WHITAKER, PA  This note has been created using automated tools and reviewed for accuracy by JASON HESTLE WHITAKER.

## 2023-11-27 NOTE — Progress Notes (Signed)
 ENCOUNTER: Patient Class :No patient class for patient encounter Department: [facility] CLINIC [address] [place] [address]  PATIENT: Patient Demographics      Name Patient ID SSN Gender Identity Birth Date   Alicia Burns, Alicia Burns [id number] Female [birth year] (43 yrs)          Address Phone Email       [address] [place] [address] [phone] [email or web address]            [facility] Caucasian/White             Reg Status PCP Date Last Verified Next Review Date     ELAPSED Cyrus Selinda Moose [id number] 10/23/23 11/22/23           Marital Status Religion Language       Married Unknown-Patient Declined English              EMERGENCY CONTACT: Name Relationship Lgl Grd Work [employer] Phone  1. Junod,DEBBIE Parent    [phone]    GUARANTOR: There is no guarantor information entered for this encounter.  COVERAGE: Primary Visit Coverage      Payer Plan Group Number Group Name Payer Phone Plan Phone   No coverage found                Secondary Visit Coverage      Payer Plan Group Number Group Name Payer Phone Plan Phone   No coverage found                Primary Coverage      Payer Plan Group Number Group Name Payer Phone Plan Phone   [employer] [id number] [facility] Employee             Primary Subscriber      Subscriber ID Subscriber Name Subscriber [facility] Subscriber Address   [id number] [name], [name] [id number]      [place], [place] [address]           Secondary Coverage      Payer Plan Group Number Group Name Payer Phone Plan Phone   [facility] [place] [facility] CLINIC [place]RA [phone] [facility] Employee             Secondary Subscriber      Subscriber ID Subscriber Name Subscriber [facility] Subscriber Address   [name], [name] [id number] [address]      [place], [place] [address]

## 2023-12-23 ENCOUNTER — Ambulatory Visit: Admitting: Urology

## 2023-12-23 DIAGNOSIS — R399 Unspecified symptoms and signs involving the genitourinary system: Secondary | ICD-10-CM

## 2024-01-26 ENCOUNTER — Ambulatory Visit: Payer: Self-pay | Admitting: Urology

## 2024-01-26 ENCOUNTER — Other Ambulatory Visit: Payer: Self-pay | Admitting: *Deleted

## 2024-01-26 ENCOUNTER — Ambulatory Visit (INDEPENDENT_AMBULATORY_CARE_PROVIDER_SITE_OTHER): Admitting: Urology

## 2024-01-26 ENCOUNTER — Encounter: Payer: Self-pay | Admitting: Urology

## 2024-01-26 VITALS — BP 132/89 | HR 86 | Ht 64.0 in | Wt 269.3 lb

## 2024-01-26 DIAGNOSIS — N390 Urinary tract infection, site not specified: Secondary | ICD-10-CM

## 2024-01-26 DIAGNOSIS — R8281 Pyuria: Secondary | ICD-10-CM

## 2024-01-26 LAB — MICROSCOPIC EXAMINATION
Epithelial Cells (non renal): >10 /HPF — AB (ref 0–10)
WBC, UA: >30 /HPF — AB (ref 0–5)

## 2024-01-26 LAB — URINALYSIS, COMPLETE
Bilirubin, UA: NEGATIVE
Bilirubin, UA: NEGATIVE
Glucose, UA: NEGATIVE
Glucose, UA: NEGATIVE
Ketones, UA: NEGATIVE
Ketones, UA: NEGATIVE
Leukocytes,UA: NEGATIVE
Nitrite, UA: POSITIVE — AB
Nitrite, UA: NEGATIVE
Specific Gravity, UA: 1.03 (ref 1.005–1.030)
Specific Gravity, UA: 1.03 (ref 1.005–1.030)
Urobilinogen, Ur: 0.2 mg/dL (ref 0.2–1.0)
Urobilinogen, Ur: 0.2 mg/dL (ref 0.2–1.0)
pH, UA: 6 (ref 5.0–7.5)
pH, UA: 5.5 (ref 5.0–7.5)

## 2024-01-26 NOTE — Progress Notes (Signed)
 01/26/2024 8:46 AM   Alicia Burns 04-01-1981 [id number]  Referring provider: Cyrus Selinda Moose, PA-C [address] [place],  [place] [address]  Chief Complaint  Patient presents with   Recurrent UTI    HPI: Alicia Burns is a 43 y.o. female referred for evaluation of recurrent UTI.  States she was referred for 3 back-to-back UTIs Chart review over the last 12 months she has had 6 urine cultures all growing mixed urogenital flora The majority of her UA showing pyuria have had significant squamous epithelial cells.  1 UA in June had significant pyuria with negative squamous epithelial cells however urine culture was negative She states initial symptoms were malaise, nausea and vomiting.  When she was treated for her second infection she did have some dysuria which persisted with her third treatment Presently her only complaint is low back pain CT renal stone study 2023 showed no urinary calculi or GU abnormalities No prior history of urologic problems   PMH: Past Medical History:  Diagnosis Date   Anxiety    Family history of adverse reaction to anesthesia    family with PONV   GERD (gastroesophageal reflux disease)    Headache     Surgical History: Past Surgical History:  Procedure Laterality Date   CERVICAL DISC ARTHROPLASTY N/A 05/30/2021   Procedure: C5-6 CERVICAL ANTERIOR DISC ARTHROPLASTY;  Surgeon: Clois Fret, MD;  Location: [facility] ORS;  Service: Neurosurgery;  Laterality: N/A;   TUBAL LIGATION     WISDOM TOOTH EXTRACTION      Home Medications:  Allergies as of 01/26/2024       Reactions   Codeine Nausea Only   Nortriptyline Other (See Comments)   Depressive mood        Medication List        Accurate as of January 26, 2024  8:46 AM. If you have any questions, ask your nurse or doctor.          STOP taking these medications    celecoxib  100 MG capsule Commonly known as: CeleBREX  Stopped by: [name]    methocarbamol  500 MG tablet Commonly known as: Robaxin  Stopped by: [name]   ondansetron  4 MG disintegrating tablet Commonly known as: ZOFRAN -ODT Stopped by: [name]   propranolol 10 MG tablet Commonly known as: INDERAL Stopped by: [name]   senna 8.6 MG Tabs tablet Commonly known as: SENOKOT Stopped by: [name]       TAKE these medications    hydrochlorothiazide 25 MG tablet Commonly known as: HYDRODIURIL Take 25 mg by mouth daily.   pantoprazole 40 MG tablet Commonly known as: PROTONIX Take 40 mg by mouth daily.   SUMAtriptan 50 MG tablet Commonly known as: IMITREX Take 50 mg by mouth daily as needed for migraine.        Allergies:  Allergies  Allergen Reactions   Codeine Nausea Only   Nortriptyline Other (See Comments)    Depressive mood    Family History: History reviewed. No pertinent family history.  Social History:  reports that she has been smoking cigarettes. She has never used smokeless tobacco. She reports that she does not drink alcohol and does not use drugs.   Physical Exam: BP 132/89   Pulse 86   Ht 5' 4 (1.626 m)   Wt 269 lb 4.8 oz (122.2 kg)   BMI 46.23 kg/m   Constitutional:  Alert, No acute distress. HEENT: [place] AT Respiratory: Normal respiratory effort, no  increased work of breathing. Psychiatric: Normal mood and affect.  Laboratory Data:  Urinalysis Microscopy 11-30 RBC/6-10 WBC/>10 epis A catheterized urine was obtained and repeat urinalysis microscopy >30 WBC/11-30 RBC/insignificant epis   Assessment & Plan:    1.  Sterile pyuria Repeat urine culture today Recommend further evaluation with renal ultrasound/cystoscopy   [name], MD  [facility] [address] [place], [place] [address] [phone]

## 2024-01-26 NOTE — Telephone Encounter (Signed)
 Pt returned call and I scheduled cysto and gave her phone number to central scheduling for RUS.

## 2024-01-26 NOTE — Progress Notes (Signed)
 In and Out Catheterization  Patient is present today for a I & O catheterization due to UTI. Patient was cleaned and prepped in a sterile fashion with betadine . A 14FR cath was inserted no complications were noted , 25ml of urine return was noted, urine was yellow in color. A clean urine sample was collected for uA. Bladder was drained  And catheter was removed with out difficulty.    Performed by: Laymon Ned, CMA and Macario Pinal, RN  Follow up/ Additional notes: No follow up

## 2024-01-27 ENCOUNTER — Telehealth: Payer: Self-pay

## 2024-01-27 NOTE — Telephone Encounter (Signed)
 Pt called and stated she has some blood with wiping and stinging. She was in office yesterday and there was a cath UA done in office. I explained this could be irritation from the catheter because she was  slightly difficult to get a  cath into. Pt understood. I suggested over the counter AZO to help with symptoms until her urine culture comes back and patient was agreeable. I also educated patient on how a cysto is done and what to expect. Pt was grateful. Pt will await culture results

## 2024-01-29 ENCOUNTER — Encounter: Payer: Self-pay | Admitting: Urology

## 2024-01-29 ENCOUNTER — Ambulatory Visit
Admission: RE | Admit: 2024-01-29 | Discharge: 2024-01-29 | Disposition: A | Discharge status: Home / Self Care | Source: Ambulatory Visit | Attending: Urology | Admitting: Urology

## 2024-01-29 DIAGNOSIS — R8281 Pyuria: Secondary | ICD-10-CM | POA: Diagnosis present

## 2024-01-29 DIAGNOSIS — N390 Urinary tract infection, site not specified: Secondary | ICD-10-CM

## 2024-01-29 LAB — CULTURE, URINE COMPREHENSIVE

## 2024-01-29 MED ORDER — SULFAMETHOXAZOLE-TRIMETHOPRIM 800-160 MG PO TABS: 1.0000 | ORAL_TABLET | Freq: Two times a day (BID) | ORAL | 0 refills | Status: AC

## 2024-01-30 ENCOUNTER — Other Ambulatory Visit: Payer: Self-pay | Admitting: *Deleted

## 2024-01-30 MED ORDER — CEFUROXIME AXETIL 250 MG PO TABS: 250.0000 mg | ORAL_TABLET | Freq: Two times a day (BID) | ORAL | 0 refills | Status: AC

## 2024-02-03 ENCOUNTER — Ambulatory Visit: Payer: Self-pay | Admitting: Urology

## 2024-02-13 ENCOUNTER — Telehealth: Payer: Self-pay

## 2024-02-13 DIAGNOSIS — B3731 Acute candidiasis of vulva and vagina: Secondary | ICD-10-CM

## 2024-02-13 DIAGNOSIS — R3989 Other symptoms and signs involving the genitourinary system: Secondary | ICD-10-CM

## 2024-02-13 MED ORDER — FLUCONAZOLE 150 MG PO TABS: 150.0000 mg | ORAL_TABLET | Freq: Once | ORAL | 1 refills | Status: AC

## 2024-02-13 MED ORDER — AMOXICILLIN-POT CLAVULANATE 875-125 MG PO TABS: 1.0000 | ORAL_TABLET | Freq: Two times a day (BID) | ORAL | 0 refills | Status: DC

## 2024-02-13 NOTE — Telephone Encounter (Signed)
 Called pt informed her of the information below. Pt voiced understanding. RX sent in for Augmentin and Diflucan as patient is prone to yeast infections.

## 2024-02-13 NOTE — Telephone Encounter (Signed)
 Incoming call on triage line from patient who states she has completed antibiotics  for UTI (Ceftin) and initially felt better and symptoms resolved. Starting yesterday she began having dysuria. Also of note she was told by the pharmacist that the antibiotic may interact with the Protonix she takes. She did attempt to hold the Protonix but was unable to secondary to reflux symptoms. She has an RX at home for Bactrim and questions if she should take that with the weekend coming. Please advise.

## 2024-02-23 ENCOUNTER — Ambulatory Visit: Admitting: Urology

## 2024-02-23 ENCOUNTER — Other Ambulatory Visit: Admitting: Urology

## 2024-02-23 ENCOUNTER — Encounter: Payer: Self-pay | Admitting: Urology

## 2024-02-23 VITALS — BP 136/94 | HR 97 | Ht 64.0 in | Wt 272.0 lb

## 2024-02-23 DIAGNOSIS — R8281 Pyuria: Secondary | ICD-10-CM

## 2024-02-23 LAB — MICROSCOPIC EXAMINATION
Bacteria, UA: NONE SEEN
Epithelial Cells (non renal): >10 /HPF — AB (ref 0–10)

## 2024-02-23 LAB — URINALYSIS, COMPLETE
Bilirubin, UA: NEGATIVE
Glucose, UA: NEGATIVE
Ketones, UA: NEGATIVE
Leukocytes,UA: NEGATIVE
Nitrite, UA: NEGATIVE
Protein,UA: NEGATIVE
Specific Gravity, UA: 1.01 (ref 1.005–1.030)
Urobilinogen, Ur: 0.2 mg/dL (ref 0.2–1.0)
pH, UA: 6 (ref 5.0–7.5)

## 2024-02-23 MED ORDER — NITROFURANTOIN MACROCRYSTAL 50 MG PO CAPS: 50.0000 mg | ORAL_CAPSULE | Freq: Every day | ORAL | 1 refills | Status: AC

## 2024-02-23 NOTE — Progress Notes (Signed)
   02/23/24  CC:  Chief Complaint  Patient presents with   Cysto    HPI: Refer to my prior note 01/26/2024.  RUS showed no abnormalities.  Urine culture 9/29 was positive for E. coli.  Symptoms have resolved with antibiotic therapy.  UA today microscopy 0-5 WBC/3-10 RBC/>10 epis  Last menstrual period 01/29/2024. NED. A&Ox3.   No respiratory distress   Abd soft, NT, ND Normal external genitalia with patent urethral meatus  Cystoscopy Procedure Note  Patient identification was confirmed, informed consent was obtained, and patient was prepped using Betadine solution.  Lidocaine  jelly was administered per urethral meatus.    Procedure: - Flexible cystoscope introduced, without any difficulty.   - Thorough search of the bladder revealed:    normal urethral meatus    normal urothelium    no stones    no ulcers     no tumors    no urethral polyps    no trabeculation  - Ureteral orifices were normal in position and appearance.  Post-Procedure: - Patient tolerated the procedure well  Assessment/ Plan: No bladder mucosal lesions on cystoscopy Short-term low-dose antibiotic prophylaxis nitrofurantoin 50 mg daily x 2 months PA follow-up 3 months for symptom recheck and repeat UA Instructed call earlier for recurrent UTI symptoms    Alicia JAYSON Barba, MD

## 2024-03-02 ENCOUNTER — Other Ambulatory Visit: Admitting: Urology

## 2024-08-24 ENCOUNTER — Ambulatory Visit: Admitting: Urology
